# Patient Record
Sex: Male | Born: 1975 | Race: White | Hispanic: No | Marital: Single | Smoking: Former smoker
Health system: Southern US, Community
[De-identification: ages and names within clinical notes are randomized; demographics above are authoritative.]

## PROBLEM LIST (undated history)

## (undated) DIAGNOSIS — I10 Essential (primary) hypertension: Secondary | ICD-10-CM

## (undated) DIAGNOSIS — J449 Chronic obstructive pulmonary disease, unspecified: Secondary | ICD-10-CM

## (undated) DIAGNOSIS — G4730 Sleep apnea, unspecified: Secondary | ICD-10-CM

## (undated) HISTORY — DX: Sleep apnea, unspecified: G47.30

## (undated) HISTORY — PX: LESION REMOVAL: SHX5196

---

## 2012-09-04 ENCOUNTER — Inpatient Hospital Stay: Payer: Self-pay | Admitting: Internal Medicine

## 2012-09-04 LAB — URINALYSIS, COMPLETE
Bilirubin,UR: NEGATIVE
Glucose,UR: NEGATIVE mg/dL (ref 0–75)
Leukocyte Esterase: NEGATIVE
Nitrite: NEGATIVE
Protein: NEGATIVE
RBC,UR: <1 /HPF (ref 0–5)
Specific Gravity: 1.017 (ref 1.003–1.030)
Squamous Epithelial: NONE SEEN
WBC UR: <1 /HPF (ref 0–5)

## 2012-09-04 LAB — COMPREHENSIVE METABOLIC PANEL
Albumin: 3.9 g/dL (ref 3.4–5.0)
Alkaline Phosphatase: 77 U/L (ref 50–136)
Anion Gap: 8 (ref 7–16)
Calcium, Total: 8.8 mg/dL (ref 8.5–10.1)
Chloride: 103 mmol/L (ref 98–107)
Co2: 26 mmol/L (ref 21–32)
Creatinine: 1.04 mg/dL (ref 0.60–1.30)
EGFR (Non-African Amer.): >60
Glucose: 138 mg/dL — ABNORMAL HIGH (ref 65–99)
Potassium: 3.6 mmol/L (ref 3.5–5.1)

## 2012-09-04 LAB — CBC
MCH: 29.9 pg (ref 26.0–34.0)
MCHC: 34.9 g/dL (ref 32.0–36.0)
MCV: 86 fL (ref 80–100)
RBC: 4.78 10*6/uL (ref 4.40–5.90)
RDW: 13.4 % (ref 11.5–14.5)
WBC: 17.6 10*3/uL — ABNORMAL HIGH (ref 3.8–10.6)

## 2012-09-05 LAB — CBC WITH DIFFERENTIAL/PLATELET
Basophil #: 0 10*3/uL (ref 0.0–0.1)
Eosinophil #: 0 10*3/uL (ref 0.0–0.7)
Eosinophil %: 0.2 %
HCT: 38.3 % — ABNORMAL LOW (ref 40.0–52.0)
HGB: 13.4 g/dL (ref 13.0–18.0)
Lymphocyte #: 1.5 10*3/uL (ref 1.0–3.6)
Lymphocyte %: 14 %
MCH: 30.4 pg (ref 26.0–34.0)
MCV: 87 fL (ref 80–100)
Monocyte #: 0.5 x10 3/mm (ref 0.2–1.0)
Platelet: 191 10*3/uL (ref 150–440)
RBC: 4.42 10*6/uL (ref 4.40–5.90)

## 2012-09-05 LAB — BASIC METABOLIC PANEL
BUN: 8 mg/dL (ref 7–18)
Calcium, Total: 8.5 mg/dL (ref 8.5–10.1)
Creatinine: 0.81 mg/dL (ref 0.60–1.30)
EGFR (Non-African Amer.): >60
Glucose: 124 mg/dL — ABNORMAL HIGH (ref 65–99)
Osmolality: 279 (ref 275–301)

## 2012-12-26 ENCOUNTER — Ambulatory Visit: Payer: Self-pay | Admitting: Specialist

## 2013-01-24 ENCOUNTER — Ambulatory Visit: Payer: Self-pay | Admitting: Specialist

## 2013-07-22 ENCOUNTER — Encounter: Payer: Self-pay | Admitting: General Surgery

## 2013-07-29 ENCOUNTER — Ambulatory Visit (INDEPENDENT_AMBULATORY_CARE_PROVIDER_SITE_OTHER): Payer: BC Managed Care – PPO | Admitting: General Surgery

## 2013-07-29 ENCOUNTER — Encounter: Payer: Self-pay | Admitting: General Surgery

## 2013-07-29 VITALS — BP 150/82 | HR 92 | Resp 14 | Ht 70.0 in | Wt 298.0 lb

## 2013-07-29 DIAGNOSIS — D1779 Benign lipomatous neoplasm of other sites: Secondary | ICD-10-CM

## 2013-07-29 DIAGNOSIS — D171 Benign lipomatous neoplasm of skin and subcutaneous tissue of trunk: Secondary | ICD-10-CM

## 2013-07-29 NOTE — Patient Instructions (Addendum)
Patient to be scheduled for excision of lipoma on back. The patient is aware to call back for any questions or concerns.   Patient is scheduled for surgery at [facility] on 08/20/13. He will pre admit by phone on 08/13/13. Patient is aware of date and instructions.

## 2013-07-29 NOTE — Progress Notes (Signed)
Patient ID: Brandon Bender, male   DOB: 1975-09-13, 38 y.o.   MRN: [id number]  Chief Complaint  Patient presents with  . Other    cyst on back    HPI Brandon Bender is a 38 y.o. male presents for an evaluation of a cyst on his back. He noticed it approximately 2 months ago. He started having shooting pain approximately 1 month that comes and goes. He states certain ways he twists irritates the area. No drainage noted.   HPI  Past Medical History  Diagnosis Date  . Sleep apnea     History reviewed. No pertinent past surgical history.  Family History  Problem Relation Age of Onset  . Cancer Mother     ovarian  . Cancer Maternal Grandmother     pancreatic, stomach, liver    Social History History  Substance Use Topics  . Smoking status: Former Smoker -- 1.00 packs/day for 5 years    Types: Cigarettes  . Smokeless tobacco: Current User    Types: Chew  . Alcohol Use: No    No Known Allergies  Current Outpatient Prescriptions  Medication Sig Dispense Refill  . aspirin 81 MG chewable tablet Chew 81 mg by mouth daily.      . Biotin 5000 MCG CAPS Take 1 capsule by mouth daily.      . Multiple Vitamins-Minerals (MULTIVITAMIN WITH MINERALS) tablet Take 1 tablet by mouth daily.       No current facility-administered medications for this visit.    Review of Systems Review of Systems  Constitutional: Negative.   Respiratory: Negative.   Cardiovascular: Negative.     Blood pressure 150/82, pulse 92, resp. rate 14, height 5\' 10"  (1.778 m), weight 298 lb (135.172 kg).  Physical Exam Physical Exam  Constitutional: He is oriented to person, place, and time. He appears well-developed and well-nourished.  Cardiovascular: Normal rate, regular rhythm and normal heart sounds.   No murmur heard. Pulmonary/Chest: Effort normal and breath sounds normal.  Neurological: He is alert and oriented to person, place, and time.  Skin: Skin is warm and dry.  3 cm subcutaneous  mass located to the left of the spine lower most chest area.     Data Reviewed   Assessment    Lipoma, symptomatic. Recommended excision.. This was explained fully to pt and he is agreeable.    Plan    Patient to be scheduled for out patient surgery for excision of lipoma on back.     Patient is scheduled for surgery at [facility] on 08/20/13. He will pre admit by phone on 08/13/13. Patient is aware of date and instructions.    Brandon Bender 08/01/2013, 8:39 AM

## 2013-08-01 ENCOUNTER — Encounter: Payer: Self-pay | Admitting: General Surgery

## 2013-08-06 ENCOUNTER — Other Ambulatory Visit: Payer: Self-pay | Admitting: General Surgery

## 2013-08-06 DIAGNOSIS — D171 Benign lipomatous neoplasm of skin and subcutaneous tissue of trunk: Secondary | ICD-10-CM

## 2013-08-20 ENCOUNTER — Ambulatory Visit: Payer: Self-pay | Admitting: General Surgery

## 2013-08-20 DIAGNOSIS — D216 Benign neoplasm of connective and other soft tissue of trunk, unspecified: Secondary | ICD-10-CM

## 2013-08-21 LAB — PATHOLOGY REPORT

## 2013-08-24 ENCOUNTER — Encounter: Payer: Self-pay | Admitting: General Surgery

## 2013-08-31 ENCOUNTER — Encounter: Payer: Self-pay | Admitting: General Surgery

## 2013-08-31 ENCOUNTER — Ambulatory Visit (INDEPENDENT_AMBULATORY_CARE_PROVIDER_SITE_OTHER): Payer: BC Managed Care – PPO | Admitting: General Surgery

## 2013-08-31 VITALS — BP 150/82 | HR 82 | Resp 14 | Ht 70.0 in | Wt 295.0 lb

## 2013-08-31 DIAGNOSIS — D1779 Benign lipomatous neoplasm of other sites: Secondary | ICD-10-CM

## 2013-08-31 DIAGNOSIS — D171 Benign lipomatous neoplasm of skin and subcutaneous tissue of trunk: Secondary | ICD-10-CM

## 2013-08-31 NOTE — Progress Notes (Signed)
This is a 38 year old male here today for his post op back lipoma excision done on 08/20/13. The patient is doing well.   Incision is clean healing and clean. No seroma or infection.  Pt reports prior pain from lipoma has recolved. RTC prn

## 2013-08-31 NOTE — Patient Instructions (Signed)
Patient to return as needed. 

## 2014-08-06 NOTE — Discharge Summary (Signed)
PATIENT NAME:  Brandon Bender, Brandon Bender MR#:  [id number] DATE OF BIRTH:  26-Sep-1975  DATE OF ADMISSION:  09/04/2012 DATE OF DISCHARGE:  09/05/2012  ADMITTING DIAGNOSES:  Fever, chills, shortness of breath and cough.   DISCHARGE DIAGNOSES: 1.  Fevers, chills, shortness of breath and cough due to acute left lower lobe pneumonia.  2.  Possible sepsis last Thursday due to pneumonia, now resolved.   CONSULTANTS: None.  PERTINENT LABORATORY EVALUATIONS: Admitting glucose was 138, BUN 12, creatinine 1.04, sodium 137, potassium 3.6, chloride 103. CO2 was 26. Calcium was 8.8. LFTs were normal. WBC count was 17.6, platelet count 202. Most recent WBC count on May 23 is 10.7. Blood cultures x 2, no growth. Influenza was negative. Urine culture no growth. ABG pH of 7.46, pCO2 of 35, pO2 of 69.  Chest x-ray showed a left lower lobe pneumonia.   HOSPITAL COURSE:  Please refer to H and P done by the admitting physician. The patient is a 39 year old male with no significant history presented with fevers, chills, shortness of breath and cough. The patient was tachycardic at a heart rate of 140 on presentation. He also had leukocytosis. He was admitted for possible sepsis syndrome due to pneumonia. The patient's heart rate is now normalized. He still has some low-grade temperature but he is feeling much better and is anxious to go home. He was noted to have a left lower lobe pneumonia and was started on ceftriaxone and azithromycin.  For ease of use, he will be changed to p.o. Levaquin. At this time, he is stable for discharge.   DISCHARGE MEDICATIONS: 1.  Ibuprofen 1 tab p.o. t.i.d. as needed as previously. 2.  Ventolin 2 puffs 4 times a day. 3.  Tylenol 650 q. 4 p.r.n. for temperature greater 100.4. 4.  Levaquin 750 one 1 tab p.o. q. 24 x 5 days.   DIET:  Regular.   ACTIVITY:  As tolerated.   FOLLOWUP:  With primary MD in 1 to 2 weeks.   Note:  35 minutes spent on the discharge.       ____________________________ Lafonda Mosses. Posey Pronto, MD shp:ce D: 09/05/2012 15:09:09 ET T: 09/05/2012 17:19:21 ET JOB#: [id number]  cc: Shailen Thielen H. Posey Pronto, MD, <Dictator> Alric Seton MD ELECTRONICALLY SIGNED 09/11/2012 14:50

## 2014-08-06 NOTE — H&P (Signed)
PATIENT NAME:  Brandon Bender, Brandon Bender MR#:  [id number] DATE OF BIRTH:  April 03, 1976  DATE OF ADMISSION:  09/04/2012  REFERRING PHYSICIAN:  Marta Antu, MD  PRIMARY CARE PHYSICIAN:  None.  CHIEF COMPLAINT: Fever, chills, shortness of breath and cough.   HISTORY OF PRESENT ILLNESS: This is a 39 year old male without significant past medical history who presents with the above complaints. He is reporting for the last 24 hours he has been having fever and chills, as well has been complaining of cough. He reports nonproductive, but he feels congested, cannot cough out.  As well, he reports a couple of people at work had the same symptoms yesterday.  He as well complains of sweating, having pleuritic chest pain with deep inspiration and coughing.  In the ED, the patient was extremely tachycardic upon presentation, at 140.  As well, he had significant leukocytosis of 17.6 thousand. As well, he was febrile at 103 and he was hypoxic to 93% on room air where he currently is improved to 97% on 2 liters nasal cannula. The patient's chest x-ray on the lateral view did show left lower lung infiltrate. The patient had blood cultures sent and started on Rocephin and Zithromax in the ED for community-acquired pneumonia.   PAST MEDICAL HISTORY: None.   PAST SURGICAL HISTORY: None.   ALLERGIES: No known drug allergies.   HOME MEDICATIONS: Advil as needed.   SOCIAL HISTORY: The patient quit smoking 3 years ago. Denies any alcohol or illicit drug use.   FAMILY HISTORY: Significant for diabetes mellitus in the family. Mother has sarcoidosis.    REVIEW OF SYSTEMS:  CONSTITUTIONAL:  The patient complains of fever, fatigue, weakness.  EYES: Denies blurry vision, double vision, pain, inflammation, glaucoma.  ENT: Denies tinnitus, ear pain, epistaxis, hearing loss.  RESPIRATORY: Complains of cough, shortness of breath. Denies any COPD or asthma.  CARDIOVASCULAR: Complains of pleuritic chest pain mainly upon coughing  and deep inspiration. Denies any edema, arrhythmia, palpitations, syncope.  GASTROINTESTINAL: Denies nausea, vomiting, diarrhea, abdominal pain, hematemesis, jaundice, rectal bleed, constipation.  GENITOURINARY: Denies dysuria, hematuria, renal colic.  ENDOCRINE: Denies polyuria, polydipsia, heat or cold intolerance.  HEMATOLOGY: Denies anemia, easy bruising, bleeding diathesis.  INTEGUMENTARY: Denies any acne, rash or skin lesions.  MUSCULOSKELETAL: Denies any swelling, gout, redness, limited activity, arthritis, or cramps.  NEUROLOGIC: Denies numbness, dysarthria, epilepsy, tremors, vertigo, CVA, TIA, or memory loss.  PSYCHIATRIC: Denies anxiety, insomnia, bipolar disorder, schizophrenia, or nervousness.   PHYSICAL EXAMINATION: VITAL SIGNS: Temperature 99.8, T-max 103, pulse 108, upon presentation 138, respiratory rate 16, upon presentation 24, blood pressure 102/57, and saturating 97% on 2 liters nasal cannula.  GENERAL: Obese male who looks comfortable in bed, in no acute distress.  HEENT: Head atraumatic, normocephalic. Pupils equal and reactive to light. Pink conjunctivae. Anicteric sclerae. Moist oral mucosa.  NECK: Supple. No thyromegaly. No JVD.  CHEST:  Good air entry bilaterally. No use of accessory muscles. The patient has left side rales and rhonchi. No wheezing.  CARDIOVASCULAR: S1, S2 heard. No rubs, murmurs, or gallops.  ABDOMEN: Soft, nontender, and nondistended. Bowel sounds present.  EXTREMITIES: No edema. No clubbing. No cyanosis. Dorsalis pedis pulse +2 bilaterally.  NEUROLOGIC: Cranial nerves grossly intact. No focal sensory or motor deficits.  PSYCHIATRIC: Appropriate affect. Awake and alert x 3. Intact judgment and insight. LYMPHATICS: No lymph nodes could be palpated in the axillary or cervical area.  SKIN: Normal skin turgor. Warm and dry. No rash.   PERTINENT LABORATORY AND DIAGNOSTICS: Glucose 138, BUN  12, creatinine 1.04, sodium 137, potassium 3.6, chloride 103,  CO2 26, ALT 51, AST 30, alk phos 77. White blood cells 17.6, hemoglobin 14.3, hematocrit 41, platelets 202.   ABG showing pH of 7.46, pCO2 of 35, and pO2 of 69 on nasal cannula.   Chest x-ray:  Initial evaluation by ED physician showing left infiltrate on the lateral view chest x-ray.  ASSESSMENT AND PLAN: 1.  Sepsis. The patient presents with tachycardia, tachypnea, fever, and leukocytosis.  His source of sepsis is related to his pneumonia. The patient had blood cultures sent and is on aggressive intravenous fluid resuscitation and his tachycardia improved with that.   2.  Pneumonia. Will treat the patient for community-acquired pneumonia.  Will start the patient on Rocephin and Zithromax. We will follow on the blood cultures. We will check sputum cultures. Will have the patient on DuoNeb every 6 hours secondary to his rales and rhonchi. Will start him on Mucinex as well.  3.  Deep vein thrombosis prophylaxis. The patient is young and ambulatory. Will have him on sequential compression device.  4.  Gastrointestinal prophylaxis. On Protonix.   CODE STATUS: FULL CODE.   TOTAL TIME SPENT ON ADMISSION AND PATIENT CARE: 50 minutes.  ____________________________ Albertine Patricia, MD dse:sb D: 09/04/2012 07:36:30 ET T: 09/04/2012 07:47:52 ET JOB#: [id number]  cc: Albertine Patricia, MD, <Dictator> Roselia Snipe Graciela Husbands MD ELECTRONICALLY SIGNED 09/06/2012 4:26
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
## 2015-09-27 DIAGNOSIS — H5213 Myopia, bilateral: Secondary | ICD-10-CM | POA: Diagnosis not present

## 2016-01-25 DIAGNOSIS — Z23 Encounter for immunization: Secondary | ICD-10-CM | POA: Diagnosis not present

## 2016-02-21 DIAGNOSIS — G4733 Obstructive sleep apnea (adult) (pediatric): Secondary | ICD-10-CM | POA: Diagnosis not present

## 2016-05-24 DIAGNOSIS — G4733 Obstructive sleep apnea (adult) (pediatric): Secondary | ICD-10-CM | POA: Diagnosis not present

## 2017-01-23 DIAGNOSIS — Z23 Encounter for immunization: Secondary | ICD-10-CM | POA: Diagnosis not present

## 2017-06-21 ENCOUNTER — Emergency Department: Payer: BLUE CROSS/BLUE SHIELD

## 2017-06-21 ENCOUNTER — Emergency Department
Admission: EM | Admit: 2017-06-21 | Discharge: 2017-06-21 | Disposition: A | Discharge status: Home / Self Care | Payer: BLUE CROSS/BLUE SHIELD | Attending: Emergency Medicine | Admitting: Emergency Medicine

## 2017-06-21 ENCOUNTER — Encounter: Payer: Self-pay | Admitting: Emergency Medicine

## 2017-06-21 DIAGNOSIS — M25512 Pain in left shoulder: Secondary | ICD-10-CM | POA: Diagnosis not present

## 2017-06-21 DIAGNOSIS — Z87891 Personal history of nicotine dependence: Secondary | ICD-10-CM | POA: Insufficient documentation

## 2017-06-21 DIAGNOSIS — M898X1 Other specified disorders of bone, shoulder: Secondary | ICD-10-CM

## 2017-06-21 DIAGNOSIS — Y92410 Unspecified street and highway as the place of occurrence of the external cause: Secondary | ICD-10-CM | POA: Diagnosis not present

## 2017-06-21 DIAGNOSIS — S161XXA Strain of muscle, fascia and tendon at neck level, initial encounter: Secondary | ICD-10-CM | POA: Insufficient documentation

## 2017-06-21 DIAGNOSIS — Z7982 Long term (current) use of aspirin: Secondary | ICD-10-CM | POA: Insufficient documentation

## 2017-06-21 DIAGNOSIS — Y999 Unspecified external cause status: Secondary | ICD-10-CM | POA: Insufficient documentation

## 2017-06-21 DIAGNOSIS — S4992XA Unspecified injury of left shoulder and upper arm, initial encounter: Secondary | ICD-10-CM | POA: Diagnosis not present

## 2017-06-21 DIAGNOSIS — M542 Cervicalgia: Secondary | ICD-10-CM | POA: Diagnosis not present

## 2017-06-21 DIAGNOSIS — Y9389 Activity, other specified: Secondary | ICD-10-CM | POA: Diagnosis not present

## 2017-06-21 DIAGNOSIS — S199XXA Unspecified injury of neck, initial encounter: Secondary | ICD-10-CM | POA: Diagnosis not present

## 2017-06-21 MED ORDER — CYCLOBENZAPRINE HCL 10 MG PO TABS: 10.0000 mg | ORAL_TABLET | Freq: Three times a day (TID) | ORAL | 0 refills | Status: AC | PRN

## 2017-06-21 MED ORDER — NAPROXEN 500 MG PO TABS: 500.0000 mg | ORAL_TABLET | Freq: Two times a day (BID) | ORAL | 0 refills | Status: DC
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
## 2017-07-13 ENCOUNTER — Inpatient Hospital Stay
Admission: EM | Admit: 2017-07-13 | Discharge: 2017-07-15 | DRG: 871 | Disposition: A | Discharge status: Home / Self Care | Payer: BLUE CROSS/BLUE SHIELD | Attending: Family Medicine | Admitting: Family Medicine

## 2017-07-13 ENCOUNTER — Emergency Department: Payer: BLUE CROSS/BLUE SHIELD

## 2017-07-13 ENCOUNTER — Other Ambulatory Visit: Payer: Self-pay

## 2017-07-13 ENCOUNTER — Encounter: Payer: Self-pay | Admitting: Emergency Medicine

## 2017-07-13 DIAGNOSIS — F17220 Nicotine dependence, chewing tobacco, uncomplicated: Secondary | ICD-10-CM | POA: Diagnosis not present

## 2017-07-13 DIAGNOSIS — Z800 Family history of malignant neoplasm of digestive organs: Secondary | ICD-10-CM

## 2017-07-13 DIAGNOSIS — R000 Tachycardia, unspecified: Secondary | ICD-10-CM | POA: Diagnosis not present

## 2017-07-13 DIAGNOSIS — J441 Chronic obstructive pulmonary disease with (acute) exacerbation: Secondary | ICD-10-CM | POA: Diagnosis not present

## 2017-07-13 DIAGNOSIS — Z79899 Other long term (current) drug therapy: Secondary | ICD-10-CM

## 2017-07-13 DIAGNOSIS — T380X5A Adverse effect of glucocorticoids and synthetic analogues, initial encounter: Secondary | ICD-10-CM | POA: Diagnosis present

## 2017-07-13 DIAGNOSIS — R6520 Severe sepsis without septic shock: Secondary | ICD-10-CM | POA: Diagnosis not present

## 2017-07-13 DIAGNOSIS — Z7982 Long term (current) use of aspirin: Secondary | ICD-10-CM

## 2017-07-13 DIAGNOSIS — I10 Essential (primary) hypertension: Secondary | ICD-10-CM | POA: Diagnosis not present

## 2017-07-13 DIAGNOSIS — E1165 Type 2 diabetes mellitus with hyperglycemia: Secondary | ICD-10-CM | POA: Diagnosis not present

## 2017-07-13 DIAGNOSIS — Z8041 Family history of malignant neoplasm of ovary: Secondary | ICD-10-CM

## 2017-07-13 DIAGNOSIS — Z8701 Personal history of pneumonia (recurrent): Secondary | ICD-10-CM | POA: Diagnosis not present

## 2017-07-13 DIAGNOSIS — J209 Acute bronchitis, unspecified: Secondary | ICD-10-CM | POA: Diagnosis present

## 2017-07-13 DIAGNOSIS — Z6841 Body Mass Index (BMI) 40.0 and over, adult: Secondary | ICD-10-CM | POA: Diagnosis not present

## 2017-07-13 DIAGNOSIS — A419 Sepsis, unspecified organism: Secondary | ICD-10-CM

## 2017-07-13 DIAGNOSIS — Z9989 Dependence on other enabling machines and devices: Secondary | ICD-10-CM | POA: Diagnosis not present

## 2017-07-13 DIAGNOSIS — J101 Influenza due to other identified influenza virus with other respiratory manifestations: Secondary | ICD-10-CM | POA: Diagnosis not present

## 2017-07-13 DIAGNOSIS — J9601 Acute respiratory failure with hypoxia: Secondary | ICD-10-CM | POA: Diagnosis not present

## 2017-07-13 DIAGNOSIS — J440 Chronic obstructive pulmonary disease with acute lower respiratory infection: Secondary | ICD-10-CM | POA: Diagnosis not present

## 2017-07-13 DIAGNOSIS — J111 Influenza due to unidentified influenza virus with other respiratory manifestations: Secondary | ICD-10-CM | POA: Diagnosis not present

## 2017-07-13 DIAGNOSIS — G4733 Obstructive sleep apnea (adult) (pediatric): Secondary | ICD-10-CM | POA: Diagnosis present

## 2017-07-13 DIAGNOSIS — R062 Wheezing: Secondary | ICD-10-CM | POA: Diagnosis not present

## 2017-07-13 HISTORY — DX: Essential (primary) hypertension: I10

## 2017-07-13 HISTORY — DX: Chronic obstructive pulmonary disease, unspecified: J44.9

## 2017-07-13 LAB — COMPREHENSIVE METABOLIC PANEL
ALT: 82 U/L — ABNORMAL HIGH (ref 17–63)
ANION GAP: 12 (ref 5–15)
AST: 74 U/L — AB (ref 15–41)
Albumin: 4.3 g/dL (ref 3.5–5.0)
Alkaline Phosphatase: 79 U/L (ref 38–126)
BILIRUBIN TOTAL: 0.9 mg/dL (ref 0.3–1.2)
BUN: 8 mg/dL (ref 6–20)
CO2: 26 mmol/L (ref 22–32)
Calcium: 9.1 mg/dL (ref 8.9–10.3)
Chloride: 99 mmol/L — ABNORMAL LOW (ref 101–111)
Creatinine, Ser: 0.81 mg/dL (ref 0.61–1.24)
GFR calc Af Amer: >60 mL/min (ref >60)
GFR calc non Af Amer: >60 mL/min (ref >60)
Glucose, Bld: 345 mg/dL — ABNORMAL HIGH (ref 65–99)
POTASSIUM: 3.9 mmol/L (ref 3.5–5.1)
Sodium: 137 mmol/L (ref 135–145)
TOTAL PROTEIN: 7.8 g/dL (ref 6.5–8.1)

## 2017-07-13 LAB — PROTIME-INR
INR: 0.93
PROTHROMBIN TIME: 12.4 s (ref 11.4–15.2)

## 2017-07-13 LAB — CBC WITH DIFFERENTIAL/PLATELET
Basophils Absolute: 0.1 10*3/uL (ref 0–0.1)
Basophils Relative: 1 %
Eosinophils Absolute: 0 10*3/uL (ref 0–0.7)
Eosinophils Relative: 0 %
HEMATOCRIT: 46.3 % (ref 40.0–52.0)
HEMOGLOBIN: 15.6 g/dL (ref 13.0–18.0)
LYMPHS ABS: 1 10*3/uL (ref 1.0–3.6)
Lymphocytes Relative: 8 %
MCH: 29.3 pg (ref 26.0–34.0)
MCHC: 33.8 g/dL (ref 32.0–36.0)
MCV: 86.7 fL (ref 80.0–100.0)
MONOS PCT: 5 %
Monocytes Absolute: 0.6 10*3/uL (ref 0.2–1.0)
NEUTROS PCT: 86 %
Neutro Abs: 10.7 10*3/uL — ABNORMAL HIGH (ref 1.4–6.5)
Platelets: 207 10*3/uL (ref 150–440)
RBC: 5.34 MIL/uL (ref 4.40–5.90)
RDW: 13.2 % (ref 11.5–14.5)
WBC: 12.3 10*3/uL — AB (ref 3.8–10.6)

## 2017-07-13 LAB — INFLUENZA PANEL BY PCR (TYPE A & B)
INFLBPCR: NEGATIVE
Influenza A By PCR: POSITIVE — AB

## 2017-07-13 LAB — LACTIC ACID, PLASMA
Lactic Acid, Venous: 3.6 mmol/L (ref 0.5–1.9)
Lactic Acid, Venous: 3.4 mmol/L (ref 0.5–1.9)

## 2017-07-13 LAB — CK: CK TOTAL: 217 U/L (ref 49–397)

## 2017-07-13 MED ORDER — HEPARIN SODIUM (PORCINE) 5000 UNIT/ML IJ SOLN
5000.0000 [IU] | Freq: Three times a day (TID) | INTRAMUSCULAR | Status: DC
  Administered 2017-07-14 (×4): 5000 [IU] via SUBCUTANEOUS
  Filled 2017-07-13 (×5): qty 1

## 2017-07-13 MED ORDER — ACETAMINOPHEN 325 MG PO TABS
650.0000 mg | ORAL_TABLET | Freq: Once | ORAL | Status: AC | PRN
  Administered 2017-07-13: 650 mg via ORAL
  Filled 2017-07-13: qty 2

## 2017-07-13 MED ORDER — SODIUM CHLORIDE 0.9 % IV SOLN: 500.0000 mg | INTRAVENOUS | Status: DC

## 2017-07-13 MED ORDER — IPRATROPIUM-ALBUTEROL 0.5-2.5 (3) MG/3ML IN SOLN
3.0000 mL | Freq: Once | RESPIRATORY_TRACT | Status: AC
  Administered 2017-07-13: 3 mL via RESPIRATORY_TRACT
  Filled 2017-07-13: qty 3

## 2017-07-13 MED ORDER — VITAMIN C 500 MG PO TABS: 1000.0000 mg | ORAL_TABLET | Freq: Every day | ORAL | Status: DC

## 2017-07-13 MED ORDER — ASPIRIN EC 81 MG PO TBEC
81.0000 mg | DELAYED_RELEASE_TABLET | Freq: Every day | ORAL | Status: DC
  Administered 2017-07-14 – 2017-07-15 (×2): 81 mg via ORAL
  Filled 2017-07-13 (×2): qty 1

## 2017-07-13 MED ORDER — OSELTAMIVIR PHOSPHATE 75 MG PO CAPS
75.0000 mg | ORAL_CAPSULE | Freq: Two times a day (BID) | ORAL | Status: DC
  Administered 2017-07-14 – 2017-07-15 (×3): 75 mg via ORAL
  Filled 2017-07-13 (×4): qty 1

## 2017-07-13 MED ORDER — DOCUSATE SODIUM 100 MG PO CAPS
100.0000 mg | ORAL_CAPSULE | Freq: Two times a day (BID) | ORAL | Status: DC
  Administered 2017-07-14 – 2017-07-15 (×3): 100 mg via ORAL
  Filled 2017-07-13 (×3): qty 1

## 2017-07-13 MED ORDER — ONDANSETRON HCL 4 MG PO TABS: 4.0000 mg | ORAL_TABLET | Freq: Four times a day (QID) | ORAL | Status: DC | PRN

## 2017-07-13 MED ORDER — DOXYCYCLINE HYCLATE 100 MG PO TABS
100.0000 mg | ORAL_TABLET | Freq: Once | ORAL | Status: AC
  Administered 2017-07-13: 100 mg via ORAL
  Filled 2017-07-13: qty 1

## 2017-07-13 MED ORDER — SODIUM CHLORIDE 0.9 % IV SOLN
INTRAVENOUS | Status: DC
  Administered 2017-07-14 – 2017-07-15 (×4): via INTRAVENOUS

## 2017-07-13 MED ORDER — BIOTIN 5000 MCG PO CAPS: 1.0000 | ORAL_CAPSULE | Freq: Every day | ORAL | Status: DC

## 2017-07-13 MED ORDER — CEFTRIAXONE SODIUM 1 G IJ SOLR
1.0000 g | INTRAMUSCULAR | Status: DC
  Administered 2017-07-14: 1 g via INTRAVENOUS
  Filled 2017-07-13 (×2): qty 10

## 2017-07-13 MED ORDER — OSELTAMIVIR PHOSPHATE 75 MG PO CAPS
75.0000 mg | ORAL_CAPSULE | Freq: Once | ORAL | Status: AC
  Administered 2017-07-13: 75 mg via ORAL
  Filled 2017-07-13: qty 1

## 2017-07-13 MED ORDER — HYDROCODONE-ACETAMINOPHEN 5-325 MG PO TABS
1.0000 | ORAL_TABLET | ORAL | Status: DC | PRN
  Administered 2017-07-14: 1 via ORAL
  Filled 2017-07-13: qty 1

## 2017-07-13 MED ORDER — SODIUM CHLORIDE 0.9 % IV SOLN
1.0000 g | Freq: Once | INTRAVENOUS | Status: AC
  Administered 2017-07-13: 1 g via INTRAVENOUS
  Filled 2017-07-13: qty 10

## 2017-07-13 MED ORDER — SODIUM CHLORIDE 0.9 % IV BOLUS
1000.0000 mL | Freq: Once | INTRAVENOUS | Status: AC
  Administered 2017-07-13: 1000 mL via INTRAVENOUS

## 2017-07-13 MED ORDER — INSULIN ASPART 100 UNIT/ML ~~LOC~~ SOLN
0.0000 [IU] | Freq: Every day | SUBCUTANEOUS | Status: DC
  Administered 2017-07-14: 3 [IU] via SUBCUTANEOUS
  Filled 2017-07-13: qty 1

## 2017-07-13 MED ORDER — BISACODYL 5 MG PO TBEC: 5.0000 mg | DELAYED_RELEASE_TABLET | Freq: Every day | ORAL | Status: DC | PRN

## 2017-07-13 MED ORDER — METHYLPREDNISOLONE SODIUM SUCC 125 MG IJ SOLR
125.0000 mg | Freq: Once | INTRAMUSCULAR | Status: AC
  Administered 2017-07-13: 125 mg via INTRAVENOUS
  Filled 2017-07-13: qty 2

## 2017-07-13 MED ORDER — ACETAMINOPHEN 325 MG PO TABS
650.0000 mg | ORAL_TABLET | Freq: Four times a day (QID) | ORAL | Status: DC | PRN
  Administered 2017-07-15: 650 mg via ORAL
  Filled 2017-07-13 (×2): qty 2

## 2017-07-13 MED ORDER — ONDANSETRON HCL 4 MG/2ML IJ SOLN
4.0000 mg | Freq: Four times a day (QID) | INTRAMUSCULAR | Status: DC | PRN
  Administered 2017-07-15: 4 mg via INTRAVENOUS
  Filled 2017-07-13: qty 2

## 2017-07-13 MED ORDER — INSULIN ASPART 100 UNIT/ML ~~LOC~~ SOLN
0.0000 [IU] | Freq: Three times a day (TID) | SUBCUTANEOUS | Status: DC
  Administered 2017-07-14: 11 [IU] via SUBCUTANEOUS
  Administered 2017-07-14: 8 [IU] via SUBCUTANEOUS
  Administered 2017-07-14: 3 [IU] via SUBCUTANEOUS
  Administered 2017-07-15: 5 [IU] via SUBCUTANEOUS
  Filled 2017-07-13 (×4): qty 1

## 2017-07-13 MED ORDER — ADULT MULTIVITAMIN W/MINERALS CH: 1.0000 | ORAL_TABLET | Freq: Every day | ORAL | Status: DC

## 2017-07-13 MED ORDER — ACETAMINOPHEN 650 MG RE SUPP: 650.0000 mg | Freq: Four times a day (QID) | RECTAL | Status: DC | PRN

## 2017-07-13 MED ORDER — CYCLOBENZAPRINE HCL 10 MG PO TABS: 10.0000 mg | ORAL_TABLET | Freq: Three times a day (TID) | ORAL | Status: DC | PRN

## 2017-07-13 MED ORDER — IPRATROPIUM-ALBUTEROL 0.5-2.5 (3) MG/3ML IN SOLN
3.0000 mL | Freq: Four times a day (QID) | RESPIRATORY_TRACT | Status: DC
  Administered 2017-07-14 – 2017-07-15 (×5): 3 mL via RESPIRATORY_TRACT
  Filled 2017-07-13 (×6): qty 3

## 2017-07-13 MED ORDER — ACETAMINOPHEN 500 MG PO TABS
1000.0000 mg | ORAL_TABLET | Freq: Once | ORAL | Status: DC
  Filled 2017-07-13: qty 2

## 2017-07-13 MED ORDER — METHYLPREDNISOLONE SODIUM SUCC 125 MG IJ SOLR
80.0000 mg | Freq: Two times a day (BID) | INTRAMUSCULAR | Status: DC
  Administered 2017-07-14: 80 mg via INTRAVENOUS
  Filled 2017-07-13: qty 2

## 2017-07-13 NOTE — ED Provider Notes (Signed)
[facility] Emergency Department Provider Note  ____________________________________________  Time seen: Approximately 9:54 PM  I have reviewed the triage vital signs and the nursing notes.   HISTORY  Chief Complaint Blood Infection   HPI Brandon Nuzum. is a 42 y.o. male the history of COPD, obstructive sleep apnea, hypertension who presents for evaluation of cough, wheezing, shortness of breath.  Patient reports that his symptoms started today. Cough productive of clear sputum, shortness of breath, wheezing. Has had nausea and one episode of nonbloody nonbilious emesis.  No diarrhea, no abdominal pain.  Patient reports that his shortness of breath is severe, constant, worse with minimal exertion.  He also has had a fever since earlier this morning.  He reports having a similar episode in the setting of pneumonia.  Patient reports that he stopped smoking 10 years ago.  He does not have an inhaler at home.   Past Medical History:  Diagnosis Date  . COPD (chronic obstructive pulmonary disease) ([place])   . Hypertension   . Sleep apnea     There are no active problems to display for this patient.   Past Surgical History:  Procedure Laterality Date  . LESION REMOVAL     back    Prior to Admission medications   Medication Sig Start Date End Date Taking? Authorizing Provider  aspirin 81 MG EC tablet Take 81 mg by mouth daily as needed.    Yes [provider]  Biotin 5000 MCG CAPS Take 1 capsule by mouth daily.   Yes [provider]  cyclobenzaprine (FLEXERIL) 10 MG tablet Take 1 tablet (10 mg total) by mouth 3 (three) times daily as needed for muscle spasms. 06/21/17  Yes Triplett, Cari B, FNP  Multiple Vitamins-Minerals (MULTIVITAMIN WITH MINERALS) tablet Take 1 tablet by mouth daily.   Yes [provider]  naproxen (NAPROSYN) 500 MG tablet Take 1 tablet (500 mg total) by mouth 2 (two) times daily with a meal. Patient  taking differently: Take 500 mg by mouth 2 (two) times daily as needed.  06/21/17  Yes Triplett, Cari B, FNP  omega-3 acid ethyl esters (LOVAZA) 1 g capsule Take 1 g by mouth daily.   Yes [provider]  vitamin C (ASCORBIC ACID) 500 MG tablet Take 1,000 mg by mouth daily.   Yes [provider]    Allergies Patient has no known allergies.  Family History  Problem Relation Age of Onset  . Cancer Mother        ovarian  . Cancer Maternal Grandmother        pancreatic, stomach, liver    Social History Social History   Tobacco Use  . Smoking status: Former Smoker    Packs/day: 1.00    Years: 5.00    Pack years: 5.00    Types: Cigarettes  . Smokeless tobacco: Current User    Types: Chew  Substance Use Topics  . Alcohol use: No  . Drug use: No    Review of Systems  Constitutional: + fever. Eyes: Negative for visual changes. ENT: Negative for sore throat. Neck: No neck pain  Cardiovascular: Negative for chest pain. Respiratory: +shortness of breath, cough Gastrointestinal: Negative for abdominal pain or diarrhea. + N.V Genitourinary: Negative for dysuria. Musculoskeletal: Negative for back pain. Skin: Negative for rash. Neurological: Negative for headaches, weakness or numbness. Psych: No SI or HI  ____________________________________________   PHYSICAL EXAM:  VITAL SIGNS: ED Triage Vitals  Enc Vitals Group  BP 07/13/17 1942 (!) 157/79     Pulse Rate 07/13/17 1942 (!) 138     Resp 07/13/17 1942 (!) 26     Temp 07/13/17 1942 (!) 102.1 F (38.9 C)     Temp src --      SpO2 07/13/17 1941 93 %     Weight 07/13/17 1943 295 lb (133.8 kg)     Height 07/13/17 1943 5\' 8"  (1.727 m)     Head Circumference --      Peak Flow --      Pain Score 07/13/17 1942 0     Pain Loc --      Pain Edu? --      Excl. in [place]? --     Constitutional: Alert and oriented. Well appearing and in no apparent distress. HEENT:      Head: Normocephalic and atraumatic.          Eyes: Conjunctivae are normal. Sclera is non-icteric.       Mouth/Throat: Mucous membranes are moist.       Neck: Supple with no signs of meningismus. Cardiovascular: Tachycardic with regular rhythm. No murmurs, gallops, or rubs. 2+ symmetrical distal pulses are present in all extremities. No JVD. Respiratory: Slightly increased work of breathing, hypoxic to 93% on room air, diffuse expiratory wheezes throughout Gastrointestinal: Soft, non tender, and non distended with positive bowel sounds. No rebound or guarding. Genitourinary: No CVA tenderness. Musculoskeletal: Nontender with normal range of motion in all extremities. No edema, cyanosis, or erythema of extremities. Neurologic: Normal speech and language. Face is symmetric. Moving all extremities. No gross focal neurologic deficits are appreciated. Skin: Skin is warm, dry and intact. No rash noted. Psychiatric: Mood and affect are normal. Speech and behavior are normal.  ____________________________________________   LABS (all labs ordered are listed, but only abnormal results are displayed)  Labs Reviewed  COMPREHENSIVE METABOLIC PANEL - Abnormal; Notable for the following components:      Result Value   Chloride 99 (*)    Glucose, Bld 345 (*)    AST 74 (*)    ALT 82 (*)    All other components within normal limits  LACTIC ACID, PLASMA - Abnormal; Notable for the following components:   Lactic Acid, Venous 3.6 (*)    All other components within normal limits  CBC WITH DIFFERENTIAL/PLATELET - Abnormal; Notable for the following components:   WBC 12.3 (*)    Neutro Abs 10.7 (*)    All other components within normal limits  INFLUENZA PANEL BY PCR (TYPE A & B) - Abnormal; Notable for the following components:   Influenza A By PCR POSITIVE (*)    All other components within normal limits  CULTURE, BLOOD (ROUTINE X 2)  CULTURE, BLOOD (ROUTINE X 2)  PROTIME-INR  CK  LACTIC ACID, PLASMA  URINALYSIS, COMPLETE (UACMP) WITH  MICROSCOPIC   ____________________________________________  EKG  ED ECG REPORT I, Rudene Re, the attending physician, personally viewed and interpreted this ECG.  Sinus tachycardia, rate of 137, prolonged QTC, normal axis, no ST elevations or depressions.  Unchanged when compared to prior from 2014 ____________________________________________  RADIOLOGY  I have personally reviewed the images performed during this visit and I agree with the Radiologist's read.   Interpretation by Radiologist:  Dg Chest 2 View  Result Date: 07/13/2017 CLINICAL DATA:  42 year old male with wheezing. EXAM: CHEST - 2 VIEW COMPARISON:  Chest radiograph dated 09/04/2012 FINDINGS: Mild peribronchial densities may represent reactive small airway disease. There is no  focal consolidation, pleural effusion, or pneumothorax. The cardiac silhouette is within normal limits. No acute osseous pathology. IMPRESSION: No focal consolidation. Findings may represent reactive small airway disease. Clinical correlation is recommended. Electronically Signed   By: Anner Crete M.D.   On: 07/13/2017 21:40     ____________________________________________   PROCEDURES  Procedure(s) performed: None Procedures Critical Care performed: yes   CRITICAL CARE Performed by: Rudene Re  ?  Total critical care time: 40 min  Critical care time was exclusive of separately billable procedures and treating other patients.  Critical care was necessary to treat or prevent imminent or life-threatening deterioration.  Critical care was time spent personally by me on the following activities: development of treatment plan with patient and/or surrogate as well as nursing, discussions with consultants, evaluation of patient's response to treatment, examination of patient, obtaining history from patient or surrogate, ordering and performing treatments and interventions, ordering and review of laboratory studies, ordering  and review of radiographic studies, pulse oximetry and re-evaluation of patient's condition.  ____________________________________________   INITIAL IMPRESSION / ASSESSMENT AND PLAN / ED COURSE  42 y.o. male the history of COPD, obstructive sleep apnea, hypertension who presents for evaluation of cough, wheezing, shortness of breath, and fever.  Patient arrives with increased work of breathing, sats of 93%, diffuse expiratory wheezes concerning for COPD exacerbation versus bronchitis versus pneumonia versus flu.  Patient is tachycardic, febrile and with an elevated lactic of 3.6, WBC 12.3. CXR showing RAD but no infiltrate. Influenza A positive. Patient meets criteria for sepsis from PNA + Flu. Given DuoNeb x3, Solu-Medrol, fluids, Tylenol, Rocephin, Doxy, Tamiflu. Patient is going to be admitted to the hospitalist service.      As part of my medical decision making, I reviewed the following data within the [place] notes reviewed and incorporated, Labs reviewed , EKG interpreted , Old EKG reviewed, Old chart reviewed, Radiograph reviewed , Discussed with admitting physician , Notes from prior ED visits and [place] Controlled Substance Database    Pertinent labs & imaging results that were available during my care of the patient were reviewed by me and considered in my medical decision making (see chart for details).    ____________________________________________   FINAL CLINICAL IMPRESSION(S) / ED DIAGNOSES  Final diagnoses:  Sepsis, due to unspecified organism ([place])  COPD exacerbation ([place])  Influenza A      NEW MEDICATIONS STARTED DURING THIS VISIT:  ED Discharge Orders    None       Note:  This document was prepared using Dragon voice recognition software and may include unintentional dictation errors.    Rudene Re, MD 07/13/17 2203

## 2017-07-13 NOTE — H&P (Signed)
[place] at [place] NAME: Brandon Bender    MR#:  [id number]  DATE OF BIRTH:  Jan 22, 1976  DATE OF ADMISSION:  07/13/2017  PRIMARY CARE PHYSICIAN: Cletis Athens, MD   REQUESTING/REFERRING PHYSICIAN:   CHIEF COMPLAINT:   Chief Complaint  Patient presents with  . Blood Infection    HISTORY OF PRESENT ILLNESS: Brandon Bender  is a 42 y.o. male with a known history of morbid obesity, COPD, hypertension, obstructive sleep apnea. Patient presented to emergency room for acute onset of severe respiratory distress associated with fever, dry cough and wheezing, started in the past 24 hours, progressively getting worse.  His symptoms are worse with exertion.  Patient also had intermittent nausea and one episode of nonbloody nonbilious emesis earlier in the day.  Patient had similar symptoms in the past when he was diagnosed with pneumonia. At the arrival to emergency room, his oxygen saturation was 93% on room air; temperature was 102.1; heart rate was 138; RR 28.  Blood present emergency room showed elevated WBC at 12.3, elevated glucose level at 345, elevated lactic acid at 3.6.  Influenza a test is positive.  Chest x-ray, reviewed by myself, shows acute bronchitis changes. Patient is admitted for further evaluation and treatment.  PAST MEDICAL HISTORY:   Past Medical History:  Diagnosis Date  . COPD (chronic obstructive pulmonary disease) ([place])   . Hypertension   . Sleep apnea     PAST SURGICAL HISTORY:  Past Surgical History:  Procedure Laterality Date  . LESION REMOVAL     back    SOCIAL HISTORY:  Social History   Tobacco Use  . Smoking status: Former Smoker    Packs/day: 1.00    Years: 5.00    Pack years: 5.00    Types: Cigarettes  . Smokeless tobacco: Current User    Types: Chew  Substance Use Topics  . Alcohol use: No    FAMILY HISTORY:  Family History  Problem Relation Age of Onset  . Cancer Mother         ovarian  . Cancer Maternal Grandmother        pancreatic, stomach, liver    DRUG ALLERGIES: No Known Allergies  REVIEW OF SYSTEMS:   CONSTITUTIONAL: Positive for fever, fatigue and generalized weakness.  EYES: No blurred or double vision.  EARS, NOSE, AND THROAT: No tinnitus or ear pain.  RESPIRATORY: Positive for dry cough, shortness of breath, wheezing; no hemoptysis.  CARDIOVASCULAR: No chest pain, orthopnea, edema.  GASTROINTESTINAL: No nausea, vomiting, diarrhea or abdominal pain.  GENITOURINARY: No dysuria, hematuria.  ENDOCRINE: No polyuria, nocturia,  HEMATOLOGY: No bleeding SKIN: No rash or lesion. MUSCULOSKELETAL: Positive for generalized body aches.   NEUROLOGIC: No focal weakness.  PSYCHIATRY: No anxiety or depression.   MEDICATIONS AT HOME:  Prior to Admission medications   Medication Sig Start Date End Date Taking? Authorizing Provider  aspirin 81 MG EC tablet Take 81 mg by mouth daily as needed.    Yes [provider]  Biotin 5000 MCG CAPS Take 1 capsule by mouth daily.   Yes [provider]  cyclobenzaprine (FLEXERIL) 10 MG tablet Take 1 tablet (10 mg total) by mouth 3 (three) times daily as needed for muscle spasms. 06/21/17  Yes Triplett, Cari B, FNP  Multiple Vitamins-Minerals (MULTIVITAMIN WITH MINERALS) tablet Take 1 tablet by mouth daily.   Yes [provider]  naproxen (NAPROSYN) 500 MG tablet Take 1 tablet (500 mg total) by mouth  2 (two) times daily with a meal. Patient taking differently: Take 500 mg by mouth 2 (two) times daily as needed.  06/21/17  Yes Triplett, Cari B, FNP  omega-3 acid ethyl esters (LOVAZA) 1 g capsule Take 1 g by mouth daily.   Yes [provider]  vitamin C (ASCORBIC ACID) 500 MG tablet Take 1,000 mg by mouth daily.   Yes [provider]      PHYSICAL EXAMINATION:   VITAL SIGNS: Blood pressure 131/76, pulse (!) 126, temperature 98.9 F (37.2 C), temperature source Oral, resp. rate 15,  height 5\' 8"  (1.727 m), weight 133.8 kg (295 lb), SpO2 91 %.  GENERAL:  42 y.o.-year-old patient, morbidly obese, lying in the bed, in mild respiratory distress, status post duo nebs and steroids.  EYES: Pupils equal, round, reactive to light and accommodation. No scleral icterus. Extraocular muscles intact.  HEENT: Head atraumatic, normocephalic. Oropharynx and nasopharynx clear.  NECK:  Supple, no jugular venous distention. No thyroid enlargement, no tenderness.  LUNGS: Reduced breath sounds and wheezing, noted bilaterally. No use of accessory muscles of respiration at this time.  CARDIOVASCULAR: S1, S2 normal. No S3/S4.  ABDOMEN: Soft, obese, nontender, nondistended. Bowel sounds present. No organomegaly or mass.  EXTREMITIES: No pedal edema, cyanosis, or clubbing.  NEUROLOGIC:  no focal weakness.  PSYCHIATRIC: The patient is alert and oriented x 3.  SKIN: No obvious rash, lesion, or ulcer.   LABORATORY PANEL:   CBC Recent Labs  Lab 07/13/17 1951  WBC 12.3*  HGB 15.6  HCT 46.3  PLT 207  MCV 86.7  MCH 29.3  MCHC 33.8  RDW 13.2  LYMPHSABS 1.0  MONOABS 0.6  EOSABS 0.0  BASOSABS 0.1   ------------------------------------------------------------------------------------------------------------------  Chemistries  Recent Labs  Lab 07/13/17 1951  NA 137  K 3.9  CL 99*  CO2 26  GLUCOSE 345*  BUN 8  CREATININE 0.81  CALCIUM 9.1  AST 74*  ALT 82*  ALKPHOS 79  BILITOT 0.9   ------------------------------------------------------------------------------------------------------------------ estimated creatinine clearance is 159 mL/min (by C-G formula based on SCr of 0.81 mg/dL). ------------------------------------------------------------------------------------------------------------------ No results for input(s): TSH, T4TOTAL, T3FREE, THYROIDAB in the last 72 hours.  Invalid input(s): FREET3   Coagulation profile Recent Labs  Lab 07/13/17 1951  INR 0.93    ------------------------------------------------------------------------------------------------------------------- No results for input(s): DDIMER in the last 72 hours. -------------------------------------------------------------------------------------------------------------------  Cardiac Enzymes No results for input(s): CKMB, TROPONINI, MYOGLOBIN in the last 168 hours.  Invalid input(s): CK ------------------------------------------------------------------------------------------------------------------ Invalid input(s): POCBNP  ---------------------------------------------------------------------------------------------------------------  Urinalysis    Component Value Date/Time   COLORURINE Yellow 09/04/2012 0616   APPEARANCEUR Clear 09/04/2012 0616   LABSPEC 1.017 09/04/2012 0616   PHURINE 6.0 09/04/2012 0616   GLUCOSEU Negative 09/04/2012 0616   HGBUR Negative 09/04/2012 0616   BILIRUBINUR Negative 09/04/2012 0616   KETONESUR Trace 09/04/2012 0616   PROTEINUR Negative 09/04/2012 0616   NITRITE Negative 09/04/2012 0616   LEUKOCYTESUR Negative 09/04/2012 0616     RADIOLOGY: Dg Chest 2 View  Result Date: 07/13/2017 CLINICAL DATA:  42 year old male with wheezing. EXAM: CHEST - 2 VIEW COMPARISON:  Chest radiograph dated 09/04/2012 FINDINGS: Mild peribronchial densities may represent reactive small airway disease. There is no focal consolidation, pleural effusion, or pneumothorax. The cardiac silhouette is within normal limits. No acute osseous pathology. IMPRESSION: No focal consolidation. Findings may represent reactive small airway disease. Clinical correlation is recommended. Electronically Signed   By: Anner Crete M.D.   On: 07/13/2017 21:40    EKG: Orders placed or  performed during the hospital encounter of 07/13/17  . EKG 12-Lead  . EKG 12-Lead  . ED EKG  . ED EKG    IMPRESSION AND PLAN:  1.  Sepsis, likely secondary to acute respiratory infection.   Will start IV antibiotics and IV fluids,Tamiflu, IV steroids and duo nebs.  Continue oxygen treatment as needed, to maintain oxygen saturation above 93%.  Follow lactic acid level. 2.  Acute hypoxemic respiratory failure, secondary to acute COPD exacerbation and flu.  See treatment above under #1. 3.  Acute COPD exacerbation, likely secondary to acute bronchitis.  We will treat with IV steroids, nebulizer treatments, and oxygen therapy. 4.  Acute bronchitis, will start IV ceftriaxone and doxycycline continue oxygen therapy, duo nebs  And IV steroids. 5.  Influenza A, will start Tamiflu.  Continue supportive measures with IV fluids, and medications for nausea, fever and pain. 6.  Diabetes type 2 new diagnosis.  We will start patient on insulin treatment while in the hospital and continue to monitor blood sugars before meals and at bedtime.  Low-carb diet and exercise regimen was discussed with patient in detail.  He would likely do well on p.o. medications at discharge.  7.  Obstructive sleep apnea, on CPAP at night.   All the records are reviewed and case discussed with ED provider. Management plans discussed with the patient and he is in agreement.  CODE STATUS: FULL    TOTAL TIME TAKING CARE OF THIS PATIENT: 50 minutes.    Amelia Jo M.D on 07/13/2017 at 10:54 PM  Between 7am to 6pm - Pager - [phone]  After 6pm go to [email or web address] - password [email or web address] [facility]  Office  [phone]  CC: Primary care physician; Cletis Athens, MD

## 2017-07-13 NOTE — ED Triage Notes (Signed)
Pt arrives ambulatory to triage with c/o of SOB which started today. Per pt, he was out yesterday in the sun all day moving tree branches. Pt reports that his concerns started this AM. Pt has noted wheezing to both sides, febrile, and tachycardic at this time.

## 2017-07-14 ENCOUNTER — Other Ambulatory Visit: Payer: Self-pay

## 2017-07-14 LAB — URINALYSIS, COMPLETE (UACMP) WITH MICROSCOPIC
Bacteria, UA: NONE SEEN
Bilirubin Urine: NEGATIVE
Glucose, UA: >=500 mg/dL — AB
Hgb urine dipstick: NEGATIVE
KETONES UR: 5 mg/dL — AB
Leukocytes, UA: NEGATIVE
Nitrite: NEGATIVE
PROTEIN: 30 mg/dL — AB
Specific Gravity, Urine: 1.037 — ABNORMAL HIGH (ref 1.005–1.030)
Squamous Epithelial / LPF: NONE SEEN
pH: 6 (ref 5.0–8.0)

## 2017-07-14 LAB — GLUCOSE, CAPILLARY
GLUCOSE-CAPILLARY: 179 mg/dL — AB (ref 65–99)
Glucose-Capillary: 188 mg/dL — ABNORMAL HIGH (ref 65–99)
Glucose-Capillary: 285 mg/dL — ABNORMAL HIGH (ref 65–99)
Glucose-Capillary: 294 mg/dL — ABNORMAL HIGH (ref 65–99)
Glucose-Capillary: 328 mg/dL — ABNORMAL HIGH (ref 65–99)

## 2017-07-14 LAB — CBC
HEMATOCRIT: 42.5 % (ref 40.0–52.0)
HEMOGLOBIN: 14.2 g/dL (ref 13.0–18.0)
MCH: 29.4 pg (ref 26.0–34.0)
MCHC: 33.5 g/dL (ref 32.0–36.0)
MCV: 87.7 fL (ref 80.0–100.0)
Platelets: 182 10*3/uL (ref 150–440)
RBC: 4.84 MIL/uL (ref 4.40–5.90)
RDW: 13 % (ref 11.5–14.5)
WBC: 11.1 10*3/uL — ABNORMAL HIGH (ref 3.8–10.6)

## 2017-07-14 LAB — LACTIC ACID, PLASMA
Lactic Acid, Venous: 3.5 mmol/L (ref 0.5–1.9)
Lactic Acid, Venous: 3.3 mmol/L (ref 0.5–1.9)
Lactic Acid, Venous: 2.9 mmol/L (ref 0.5–1.9)
Lactic Acid, Venous: 2.3 mmol/L (ref 0.5–1.9)
Lactic Acid, Venous: 3.2 mmol/L (ref 0.5–1.9)

## 2017-07-14 LAB — BASIC METABOLIC PANEL
ANION GAP: 11 (ref 5–15)
BUN: 9 mg/dL (ref 6–20)
CHLORIDE: 105 mmol/L (ref 101–111)
CO2: 23 mmol/L (ref 22–32)
Calcium: 8.2 mg/dL — ABNORMAL LOW (ref 8.9–10.3)
Creatinine, Ser: 0.84 mg/dL (ref 0.61–1.24)
GFR calc Af Amer: >60 mL/min (ref >60)
GFR calc non Af Amer: >60 mL/min (ref >60)
GLUCOSE: 339 mg/dL — AB (ref 65–99)
POTASSIUM: 4.1 mmol/L (ref 3.5–5.1)
Sodium: 139 mmol/L (ref 135–145)

## 2017-07-14 MED ORDER — DOXYCYCLINE HYCLATE 100 MG PO TABS
100.0000 mg | ORAL_TABLET | Freq: Two times a day (BID) | ORAL | Status: DC
  Administered 2017-07-14 – 2017-07-15 (×3): 100 mg via ORAL
  Filled 2017-07-14 (×3): qty 1

## 2017-07-14 MED ORDER — SODIUM CHLORIDE 0.9 % IV BOLUS
1000.0000 mL | Freq: Once | INTRAVENOUS | Status: AC
  Administered 2017-07-14: 1000 mL via INTRAVENOUS

## 2017-07-14 MED ORDER — INSULIN GLARGINE 100 UNIT/ML ~~LOC~~ SOLN
8.0000 [IU] | Freq: Every day | SUBCUTANEOUS | Status: DC
  Administered 2017-07-14 – 2017-07-15 (×2): 8 [IU] via SUBCUTANEOUS
  Filled 2017-07-14 (×3): qty 0.08

## 2017-07-14 MED ORDER — VITAMIN C 500 MG PO TABS
1000.0000 mg | ORAL_TABLET | Freq: Every day | ORAL | Status: DC
Start: 2017-07-14 — End: 2017-07-15
  Administered 2017-07-14: 1000 mg via ORAL
  Filled 2017-07-14 (×2): qty 2

## 2017-07-14 MED ORDER — ADULT MULTIVITAMIN W/MINERALS CH
1.0000 | ORAL_TABLET | Freq: Every day | ORAL | Status: DC
  Administered 2017-07-14: 1 via ORAL
  Filled 2017-07-14: qty 1

## 2017-07-14 MED ORDER — PREDNISONE 50 MG PO TABS
50.0000 mg | ORAL_TABLET | Freq: Every day | ORAL | Status: DC
  Administered 2017-07-15: 50 mg via ORAL
  Filled 2017-07-14: qty 1

## 2017-07-14 MED ORDER — BENZONATATE 100 MG PO CAPS
200.0000 mg | ORAL_CAPSULE | Freq: Three times a day (TID) | ORAL | Status: DC | PRN
  Administered 2017-07-14: 200 mg via ORAL
  Filled 2017-07-14: qty 2

## 2017-07-14 NOTE — Progress Notes (Signed)
[place] at [place] NAME: Brandon Bender    MR#:  [id number]  DATE OF BIRTH:  Nov 25, 1975  SUBJECTIVE:  CHIEF COMPLAINT:   Chief Complaint  Patient presents with  . Blood Infection  Patient is feeling better today, no events overnight per nursing staff  REVIEW OF SYSTEMS:  CONSTITUTIONAL: No fever, fatigue or weakness.  EYES: No blurred or double vision.  EARS, NOSE, AND THROAT: No tinnitus or ear pain.  RESPIRATORY: No cough, shortness of breath, wheezing or hemoptysis.  CARDIOVASCULAR: No chest pain, orthopnea, edema.  GASTROINTESTINAL: No nausea, vomiting, diarrhea or abdominal pain.  GENITOURINARY: No dysuria, hematuria.  ENDOCRINE: No polyuria, nocturia,  HEMATOLOGY: No anemia, easy bruising or bleeding SKIN: No rash or lesion. MUSCULOSKELETAL: No joint pain or arthritis.   NEUROLOGIC: No tingling, numbness, weakness.  PSYCHIATRY: No anxiety or depression.   ROS  DRUG ALLERGIES:  No Known Allergies  VITALS:  Blood pressure (!) 141/83, pulse (!) 113, temperature 99.2 F (37.3 C), temperature source Oral, resp. rate 18, height 5\' 7"  (1.702 m), weight (!) 136.4 kg (300 lb 9.6 oz), SpO2 94 %.  PHYSICAL EXAMINATION:  GENERAL:  42 y.o.-year-old patient lying in the bed with no acute distress.  EYES: Pupils equal, round, reactive to light and accommodation. No scleral icterus. Extraocular muscles intact.  HEENT: Head atraumatic, normocephalic. Oropharynx and nasopharynx clear.  NECK:  Supple, no jugular venous distention. No thyroid enlargement, no tenderness.  LUNGS: Normal breath sounds bilaterally, no wheezing, rales,rhonchi or crepitation. No use of accessory muscles of respiration.  CARDIOVASCULAR: S1, S2 normal. No murmurs, rubs, or gallops.  ABDOMEN: Soft, nontender, nondistended. Bowel sounds present. No organomegaly or mass.  EXTREMITIES: No pedal edema, cyanosis, or clubbing.  NEUROLOGIC: Cranial nerves II through XII  are intact. Muscle strength 5/5 in all extremities. Sensation intact. Gait not checked.  PSYCHIATRIC: The patient is alert and oriented x 3.  SKIN: No obvious rash, lesion, or ulcer.   Physical Exam LABORATORY PANEL:   CBC Recent Labs  Lab 07/14/17 0132  WBC 11.1*  HGB 14.2  HCT 42.5  PLT 182   ------------------------------------------------------------------------------------------------------------------  Chemistries  Recent Labs  Lab 07/13/17 1951 07/14/17 0132  NA 137 139  K 3.9 4.1  CL 99* 105  CO2 26 23  GLUCOSE 345* 339*  BUN 8 9  CREATININE 0.81 0.84  CALCIUM 9.1 8.2*  AST 74*  --   ALT 82*  --   ALKPHOS 79  --   BILITOT 0.9  --    ------------------------------------------------------------------------------------------------------------------  Cardiac Enzymes No results for input(s): TROPONINI in the last 168 hours. ------------------------------------------------------------------------------------------------------------------  RADIOLOGY:  Dg Chest 2 View  Result Date: 07/13/2017 CLINICAL DATA:  42 year old male with wheezing. EXAM: CHEST - 2 VIEW COMPARISON:  Chest radiograph dated 09/04/2012 FINDINGS: Mild peribronchial densities may represent reactive small airway disease. There is no focal consolidation, pleural effusion, or pneumothorax. The cardiac silhouette is within normal limits. No acute osseous pathology. IMPRESSION: No focal consolidation. Findings may represent reactive small airway disease. Clinical correlation is recommended. Electronically Signed   By: Anner Crete M.D.   On: 07/13/2017 21:40    ASSESSMENT AND PLAN:  1 acute sepsis secondary to acute influenza Resolved Continue IV fluids for rehydration  2 acute influenza type a Stable Continue supportive care, Tamiflu for 5-day course, and IV fluids for rehydration  3 acute on chronic obstructive pulmonary disease exacerbation Resolving Discontinue IV Solu-Medrol, start  prednisone taper, breathing treatments  as needed, mucolytic agents, and continue close medical monitoring  4 chronic morbid obesity  most likely secondary to excess calories Lifestyle modification recommended  5 chronic diabetes mellitus type 2 with hyperglycemia  Exacerbated by steroids  IV Solu-Medrol will be discontinued, increase sliding scale insulin, Accu-Cheks per routine, ADA diet   6 chronic obstructive sleep apnea  Stable  Continue CPAP at bedtime/as needed    All the records are reviewed and case discussed with Care Management/Social Workerr. Management plans discussed with the patient, family and they are in agreement.  CODE STATUS: full  TOTAL TIME TAKING CARE OF THIS PATIENT: 35 minutes.     POSSIBLE D/C IN 1-2 DAYS, DEPENDING ON CLINICAL CONDITION.   Avel Peace Salary M.D on 07/14/2017   Between 7am to 6pm - Pager - [phone]  After 6pm go to [email or web address] - password [email or web address] [facility]  Office  [phone]  CC: Primary care physician; Cletis Athens, MD  Note: This dictation was prepared with Dragon dictation along with smaller phrase technology. Any transcriptional errors that result from this process are unintentional.

## 2017-07-14 NOTE — Progress Notes (Signed)
Pt refused to wear Cpap tonight. No distress noted. Will continue to monitor pt.

## 2017-07-14 NOTE — Progress Notes (Addendum)
Pt Lactic Acid 3.3. Primary Nurse paged and spoke to Dr. Jerelyn Charles. No new orders. Primary nurse to continue to monitor.

## 2017-07-14 NOTE — Progress Notes (Signed)
Pt Lactic Acid elevated on last lab draw at 3.4. Pt has received 3 bolus of NS. Primary nurse paged and spoke to spoke to Dr. Duane Boston. Orders received for  Lactic Acid to be drawn once more. Primary nurse to continue to monitor.

## 2017-07-14 NOTE — Progress Notes (Signed)
Primary nurse was notified by lab of critical Lactic Acid at 3.5. Primary nurse paged and spoke to Dr. Duane Boston. Orders received to recheck pt lactic in 3 hours. Primary nurse to continue to monitor.

## 2017-07-15 LAB — BASIC METABOLIC PANEL
Anion gap: 10 (ref 5–15)
BUN: 13 mg/dL (ref 6–20)
CHLORIDE: 105 mmol/L (ref 101–111)
CO2: 22 mmol/L (ref 22–32)
CREATININE: 0.77 mg/dL (ref 0.61–1.24)
Calcium: 8.1 mg/dL — ABNORMAL LOW (ref 8.9–10.3)
GFR calc non Af Amer: >60 mL/min (ref >60)
GLUCOSE: 247 mg/dL — AB (ref 65–99)
Potassium: 3.6 mmol/L (ref 3.5–5.1)
Sodium: 137 mmol/L (ref 135–145)

## 2017-07-15 LAB — CBC WITH DIFFERENTIAL/PLATELET
BASOS ABS: 0.1 10*3/uL (ref 0–0.1)
BASOS PCT: 1 %
EOS PCT: 0 %
Eosinophils Absolute: 0 10*3/uL (ref 0–0.7)
HCT: 39.1 % — ABNORMAL LOW (ref 40.0–52.0)
Hemoglobin: 13.8 g/dL (ref 13.0–18.0)
LYMPHS ABS: 1.4 10*3/uL (ref 1.0–3.6)
Lymphocytes Relative: 11 %
MCH: 30.6 pg (ref 26.0–34.0)
MCHC: 35.2 g/dL (ref 32.0–36.0)
MCV: 86.9 fL (ref 80.0–100.0)
Monocytes Absolute: 1 10*3/uL (ref 0.2–1.0)
Monocytes Relative: 8 %
NEUTROS PCT: 80 %
Neutro Abs: 10 10*3/uL — ABNORMAL HIGH (ref 1.4–6.5)
PLATELETS: 198 10*3/uL (ref 150–440)
RBC: 4.5 MIL/uL (ref 4.40–5.90)
RDW: 13.6 % (ref 11.5–14.5)
WBC: 12.5 10*3/uL — ABNORMAL HIGH (ref 3.8–10.6)

## 2017-07-15 LAB — LACTIC ACID, PLASMA: Lactic Acid, Venous: 1.3 mmol/L (ref 0.5–1.9)

## 2017-07-15 LAB — GLUCOSE, CAPILLARY: GLUCOSE-CAPILLARY: 243 mg/dL — AB (ref 65–99)

## 2017-07-15 MED ORDER — PREDNISONE 50 MG PO TABS: ORAL_TABLET | ORAL | 0 refills | Status: DC

## 2017-07-15 MED ORDER — DOXYCYCLINE HYCLATE 100 MG PO TABS: 100.0000 mg | ORAL_TABLET | Freq: Two times a day (BID) | ORAL | 0 refills | Status: DC

## 2017-07-15 MED ORDER — BENZONATATE 200 MG PO CAPS: 200.0000 mg | ORAL_CAPSULE | Freq: Three times a day (TID) | ORAL | 0 refills | Status: DC | PRN

## 2017-07-15 MED ORDER — ALBUTEROL SULFATE HFA 108 (90 BASE) MCG/ACT IN AERS: 2.0000 | INHALATION_SPRAY | RESPIRATORY_TRACT | 0 refills | Status: AC | PRN

## 2017-07-15 MED ORDER — OSELTAMIVIR PHOSPHATE 75 MG PO CAPS: 75.0000 mg | ORAL_CAPSULE | Freq: Two times a day (BID) | ORAL | 0 refills | Status: DC

## 2017-07-15 MED ORDER — ACETAMINOPHEN 325 MG PO TABS: 650.0000 mg | ORAL_TABLET | Freq: Four times a day (QID) | ORAL | 0 refills | Status: AC | PRN

## 2017-07-15 NOTE — Discharge Summary (Signed)
[facility] NAME: Brandon Bender    MR#:  [id number]  DATE OF BIRTH:  [birth year]  DATE OF ADMISSION:  07/13/2017 ADMITTING PHYSICIAN: Amelia Jo, MD  DATE OF DISCHARGE: No discharge date for patient encounter.  PRIMARY CARE PHYSICIAN: Cletis Athens, MD    ADMISSION DIAGNOSIS:  Influenza A [J10.1] COPD exacerbation ([place]) [J44.1] Sepsis, due to unspecified organism ([place]) [A41.9]  DISCHARGE DIAGNOSIS:  Active Problems:   Sepsis ([place])   SECONDARY DIAGNOSIS:   Past Medical History:  Diagnosis Date  . COPD (chronic obstructive pulmonary disease) ([place])   . Hypertension   . Sleep apnea     HOSPITAL COURSE:  1 acute sepsis secondary to acute influenza Resolved Treated with IV fluids for rehydration  2 acute influenza type a Resolving Provided supportive care, Tamiflu for 5-day course, IV fluids for rehydration, and patient did well  3 acute on chronic obstructive pulmonary disease exacerbation Resolved Treated with short course IV Solu-Medrol, on prednisone taper, provided breathing treatments as needed, and patient did well   4 chronic morbid obesity  Stable most likely secondary to excess calories Lifestyle modification recommended  5 chronic diet-controlled diabetes mellitus type 2 with hyperglycemia  Exacerbated by steroids  Treated with short course IV Solu-Medrol, provided sliding scale insulin, Accu-Cheks per routine, ADA diet, and patient did well   6 chronic obstructive sleep apnea  Stable  Continued CPAP at bedtime/as needed   DISCHARGE CONDITIONS:  On the day of discharge patient is hemodynamically stable, ambulating without difficulty, tolerating a diet, successfully weaned off oxygen, ready for discharge to home with appropriate follow-up, for more specific details please see chart  CONSULTS OBTAINED:    DRUG ALLERGIES:  No Known Allergies  DISCHARGE MEDICATIONS:   Allergies  as of 07/15/2017   No Known Allergies     Medication List    TAKE these medications   acetaminophen 325 MG tablet Commonly known as:  TYLENOL Take 2 tablets (650 mg total) by mouth every 6 (six) hours as needed for mild pain (or Fever >/= 101).   albuterol 108 (90 Base) MCG/ACT inhaler Commonly known as:  PROVENTIL HFA;VENTOLIN HFA Inhale 2 puffs into the lungs every 4 (four) hours as needed for wheezing or shortness of breath.   aspirin 81 MG EC tablet Take 81 mg by mouth daily as needed.   benzonatate 200 MG capsule Commonly known as:  TESSALON Take 1 capsule (200 mg total) by mouth 3 (three) times daily as needed for cough.   Biotin 5000 MCG Caps Take 1 capsule by mouth daily.   cyclobenzaprine 10 MG tablet Commonly known as:  FLEXERIL Take 1 tablet (10 mg total) by mouth 3 (three) times daily as needed for muscle spasms.   doxycycline 100 MG tablet Commonly known as:  VIBRA-TABS Take 1 tablet (100 mg total) by mouth every 12 (twelve) hours.   multivitamin with minerals tablet Take 1 tablet by mouth daily.   naproxen 500 MG tablet Commonly known as:  NAPROSYN Take 1 tablet (500 mg total) by mouth 2 (two) times daily with a meal. What changed:    when to take this  reasons to take this   omega-3 acid ethyl esters 1 g capsule Commonly known as:  LOVAZA Take 1 g by mouth daily.   oseltamivir 75 MG capsule Commonly known as:  TAMIFLU Take 1 capsule (75 mg total) by mouth 2 (two) times daily.   predniSONE 50 MG tablet  Commonly known as:  DELTASONE 1 po daily Start taking on:  07/16/2017   vitamin C 500 MG tablet Commonly known as:  ASCORBIC ACID Take 1,000 mg by mouth daily.        DISCHARGE INSTRUCTIONS:   If you experience worsening of your admission symptoms, develop shortness of breath, life threatening emergency, suicidal or homicidal thoughts you must seek medical attention immediately by calling 911 or calling your MD immediately  if symptoms less  severe.  You Must read complete instructions/literature along with all the possible adverse reactions/side effects for all the Medicines you take and that have been prescribed to you. Take any new Medicines after you have completely understood and accept all the possible adverse reactions/side effects.   Please note  You were cared for by a hospitalist during your hospital stay. If you have any questions about your discharge medications or the care you received while you were in the hospital after you are discharged, you can call the unit and asked to speak with the hospitalist on call if the hospitalist that took care of you is not available. Once you are discharged, your primary care physician will handle any further medical issues. Please note that NO REFILLS for any discharge medications will be authorized once you are discharged, as it is imperative that you return to your primary care physician (or establish a relationship with a primary care physician if you do not have one) for your aftercare needs so that they can reassess your need for medications and monitor your lab values.    Today   CHIEF COMPLAINT:   Chief Complaint  Patient presents with  . Blood Infection    HISTORY OF PRESENT ILLNESS:  42 y.o. male with a known history of morbid obesity, COPD, hypertension, obstructive sleep apnea. Patient presented to emergency room for acute onset of severe respiratory distress associated with fever, dry cough and wheezing, started in the past 24 hours, progressively getting worse.  His symptoms are worse with exertion.  Patient also had intermittent nausea and one episode of nonbloody nonbilious emesis earlier in the day.  Patient had similar symptoms in the past when he was diagnosed with pneumonia. At the arrival to emergency room, his oxygen saturation was 93% on room air; temperature was 102.1; heart rate was 138; RR 28.  Blood present emergency room showed elevated WBC at 12.3,  elevated glucose level at 345, elevated lactic acid at 3.6.  Influenza a test is positive.  Chest x-ray, reviewed by myself, shows acute bronchitis changes. Patient is admitted for further evaluation and treatment.    VITAL SIGNS:  Blood pressure 131/67, pulse (!) 120, temperature 99.2 F (37.3 C), temperature source Oral, resp. rate (!) 21, height 5\' 7"  (1.702 m), weight (!) 136.4 kg (300 lb 9.6 oz), SpO2 93 %.  I/O:    Intake/Output Summary (Last 24 hours) at 07/15/2017 0940 Last data filed at 07/15/2017 0800 Gross per 24 hour  Intake 3088 ml  Output 1500 ml  Net 1588 ml    PHYSICAL EXAMINATION:  GENERAL:  42 y.o.-year-old patient lying in the bed with no acute distress.  EYES: Pupils equal, round, reactive to light and accommodation. No scleral icterus. Extraocular muscles intact.  HEENT: Head atraumatic, normocephalic. Oropharynx and nasopharynx clear.  NECK:  Supple, no jugular venous distention. No thyroid enlargement, no tenderness.  LUNGS: Normal breath sounds bilaterally, no wheezing, rales,rhonchi or crepitation. No use of accessory muscles of respiration.  CARDIOVASCULAR: S1, S2 normal. No murmurs,  rubs, or gallops.  ABDOMEN: Soft, non-tender, non-distended. Bowel sounds present. No organomegaly or mass.  EXTREMITIES: No pedal edema, cyanosis, or clubbing.  NEUROLOGIC: Cranial nerves II through XII are intact. Muscle strength 5/5 in all extremities. Sensation intact. Gait not checked.  PSYCHIATRIC: The patient is alert and oriented x 3.  SKIN: No obvious rash, lesion, or ulcer.   DATA REVIEW:   CBC Recent Labs  Lab 07/15/17 0328  WBC 12.5*  HGB 13.8  HCT 39.1*  PLT 198    Chemistries  Recent Labs  Lab 07/13/17 1951  07/15/17 0328  NA 137   < > 137  K 3.9   < > 3.6  CL 99*   < > 105  CO2 26   < > 22  GLUCOSE 345*   < > 247*  BUN 8   < > 13  CREATININE 0.81   < > 0.77  CALCIUM 9.1   < > 8.1*  AST 74*  --   --   ALT 82*  --   --   ALKPHOS 79  --   --    BILITOT 0.9  --   --    < > = values in this interval not displayed.    Cardiac Enzymes No results for input(s): TROPONINI in the last 168 hours.  Microbiology Results  Results for orders placed or performed during the hospital encounter of 07/13/17  Culture, blood (Routine x 2)     Status: None (Preliminary result)   Collection Time: 07/13/17  7:51 PM  Result Value Ref Range Status   Specimen Description   Final    BLOOD Blood Culture results may not be optimal due to an excessive volume of blood received in culture bottles   Special Requests   Final    BOTTLES DRAWN AEROBIC AND ANAEROBIC RIGHT ANTECUBITAL   Culture   Final    NO GROWTH 2 DAYS Performed at [facility], [address], [place], [place] [address]    Report Status PENDING  Incomplete  Culture, blood (Routine x 2)     Status: None (Preliminary result)   Collection Time: 07/13/17  8:46 PM  Result Value Ref Range Status   Specimen Description BLOOD LEFT ANTECUBITAL  Final   Special Requests   Final    BOTTLES DRAWN AEROBIC AND ANAEROBIC Blood Culture adequate volume   Culture   Final    NO GROWTH 2 DAYS Performed at [facility], [address] [place], [place] [address]    Report Status PENDING  Incomplete    RADIOLOGY:  Dg Chest 2 View  Result Date: 07/13/2017 CLINICAL DATA:  42 year old male with wheezing. EXAM: CHEST - 2 VIEW COMPARISON:  Chest radiograph dated 09/04/2012 FINDINGS: Mild peribronchial densities may represent reactive small airway disease. There is no focal consolidation, pleural effusion, or pneumothorax. The cardiac silhouette is within normal limits. No acute osseous pathology. IMPRESSION: No focal consolidation. Findings may represent reactive small airway disease. Clinical correlation is recommended. Electronically Signed   By: Anner Crete M.D.   On: 07/13/2017 21:40    EKG:   Orders placed or performed during the hospital encounter of 07/13/17  . EKG 12-Lead   . EKG 12-Lead  . ED EKG  . ED EKG      Management plans discussed with the patient, family and they are in agreement.  CODE STATUS:     Code Status Orders  (From admission, onward)  Start     Ordered   07/13/17 2356  Full code  Continuous     07/13/17 2355    Code Status History    This patient has a current code status but no historical code status.      TOTAL TIME TAKING CARE OF THIS PATIENT: 45 minutes.     Avel Peace Salary M.D on 07/15/2017 at 9:40 AM  Between 7am to 6pm - Pager - [phone]  After 6pm go to [email or web address] - password [email or web address] [facility]  Office  [phone]  CC: Primary care physician; Cletis Athens, MD   Note: This dictation was prepared with Dragon dictation along with smaller phrase technology. Any transcriptional errors that result from this process are unintentional.

## 2017-07-15 NOTE — Progress Notes (Signed)
IV and tele were removed. Discharge instructions, follow-up appointments, and prescriptions were provided to the pt. All questions answered. The pt was taken downstairs via wheelchair by volunteers.

## 2017-07-16 LAB — HIV ANTIBODY (ROUTINE TESTING W REFLEX): HIV SCREEN 4TH GENERATION: NONREACTIVE

## 2017-07-18 DIAGNOSIS — G4733 Obstructive sleep apnea (adult) (pediatric): Secondary | ICD-10-CM | POA: Diagnosis not present

## 2017-07-18 DIAGNOSIS — J1100 Influenza due to unidentified influenza virus with unspecified type of pneumonia: Secondary | ICD-10-CM | POA: Diagnosis not present

## 2017-07-18 DIAGNOSIS — E669 Obesity, unspecified: Secondary | ICD-10-CM | POA: Diagnosis not present

## 2017-07-18 DIAGNOSIS — F5101 Primary insomnia: Secondary | ICD-10-CM | POA: Diagnosis not present

## 2017-07-18 LAB — CULTURE, BLOOD (ROUTINE X 2)
Culture: NO GROWTH
Culture: NO GROWTH
Special Requests: ADEQUATE

## 2017-08-22 DIAGNOSIS — G4733 Obstructive sleep apnea (adult) (pediatric): Secondary | ICD-10-CM | POA: Diagnosis not present

## 2017-08-22 DIAGNOSIS — F5101 Primary insomnia: Secondary | ICD-10-CM | POA: Diagnosis not present

## 2017-08-22 DIAGNOSIS — E669 Obesity, unspecified: Secondary | ICD-10-CM | POA: Diagnosis not present

## 2017-08-27 DIAGNOSIS — C61 Malignant neoplasm of prostate: Secondary | ICD-10-CM | POA: Diagnosis not present

## 2017-08-27 DIAGNOSIS — E034 Atrophy of thyroid (acquired): Secondary | ICD-10-CM | POA: Diagnosis not present

## 2017-08-27 DIAGNOSIS — E119 Type 2 diabetes mellitus without complications: Secondary | ICD-10-CM | POA: Diagnosis not present

## 2017-08-27 DIAGNOSIS — E7849 Other hyperlipidemia: Secondary | ICD-10-CM | POA: Diagnosis not present

## 2017-08-27 DIAGNOSIS — I10 Essential (primary) hypertension: Secondary | ICD-10-CM | POA: Diagnosis not present

## 2017-09-05 DIAGNOSIS — E119 Type 2 diabetes mellitus without complications: Secondary | ICD-10-CM | POA: Diagnosis not present

## 2017-09-05 DIAGNOSIS — E118 Type 2 diabetes mellitus with unspecified complications: Secondary | ICD-10-CM | POA: Diagnosis not present

## 2017-09-05 DIAGNOSIS — E669 Obesity, unspecified: Secondary | ICD-10-CM | POA: Diagnosis not present

## 2017-09-05 DIAGNOSIS — I10 Essential (primary) hypertension: Secondary | ICD-10-CM | POA: Diagnosis not present

## 2017-09-25 DIAGNOSIS — W010XXA Fall on same level from slipping, tripping and stumbling without subsequent striking against object, initial encounter: Secondary | ICD-10-CM | POA: Diagnosis not present

## 2017-09-25 DIAGNOSIS — S99921A Unspecified injury of right foot, initial encounter: Secondary | ICD-10-CM | POA: Diagnosis not present

## 2017-09-25 DIAGNOSIS — M19071 Primary osteoarthritis, right ankle and foot: Secondary | ICD-10-CM | POA: Diagnosis not present

## 2017-12-11 DIAGNOSIS — G4733 Obstructive sleep apnea (adult) (pediatric): Secondary | ICD-10-CM | POA: Diagnosis not present

## 2017-12-11 DIAGNOSIS — J45909 Unspecified asthma, uncomplicated: Secondary | ICD-10-CM | POA: Diagnosis not present

## 2017-12-11 DIAGNOSIS — I10 Essential (primary) hypertension: Secondary | ICD-10-CM | POA: Diagnosis not present

## 2017-12-11 DIAGNOSIS — E119 Type 2 diabetes mellitus without complications: Secondary | ICD-10-CM | POA: Diagnosis not present

## 2017-12-23 DIAGNOSIS — J40 Bronchitis, not specified as acute or chronic: Secondary | ICD-10-CM | POA: Diagnosis not present

## 2017-12-23 DIAGNOSIS — E119 Type 2 diabetes mellitus without complications: Secondary | ICD-10-CM | POA: Diagnosis not present

## 2017-12-23 DIAGNOSIS — E118 Type 2 diabetes mellitus with unspecified complications: Secondary | ICD-10-CM | POA: Diagnosis not present

## 2017-12-23 DIAGNOSIS — I10 Essential (primary) hypertension: Secondary | ICD-10-CM | POA: Diagnosis not present

## 2017-12-23 DIAGNOSIS — J45909 Unspecified asthma, uncomplicated: Secondary | ICD-10-CM | POA: Diagnosis not present

## 2017-12-30 DIAGNOSIS — E119 Type 2 diabetes mellitus without complications: Secondary | ICD-10-CM | POA: Diagnosis not present

## 2017-12-30 DIAGNOSIS — I10 Essential (primary) hypertension: Secondary | ICD-10-CM | POA: Diagnosis not present

## 2017-12-30 DIAGNOSIS — J45909 Unspecified asthma, uncomplicated: Secondary | ICD-10-CM | POA: Diagnosis not present

## 2017-12-30 DIAGNOSIS — E118 Type 2 diabetes mellitus with unspecified complications: Secondary | ICD-10-CM | POA: Diagnosis not present

## 2018-01-24 DIAGNOSIS — Z23 Encounter for immunization: Secondary | ICD-10-CM | POA: Diagnosis not present

## 2018-02-10 DIAGNOSIS — E118 Type 2 diabetes mellitus with unspecified complications: Secondary | ICD-10-CM | POA: Diagnosis not present

## 2018-02-10 DIAGNOSIS — E119 Type 2 diabetes mellitus without complications: Secondary | ICD-10-CM | POA: Diagnosis not present

## 2018-02-10 DIAGNOSIS — I10 Essential (primary) hypertension: Secondary | ICD-10-CM | POA: Diagnosis not present

## 2018-02-10 DIAGNOSIS — J45909 Unspecified asthma, uncomplicated: Secondary | ICD-10-CM | POA: Diagnosis not present

## 2018-02-20 DIAGNOSIS — E119 Type 2 diabetes mellitus without complications: Secondary | ICD-10-CM | POA: Diagnosis not present

## 2018-04-07 IMAGING — CR DG CHEST 2V
1 series · 2 of 2 positions shown · non-contrast
Comparison: Chest radiograph dated 09/04/2012

CLINICAL DATA: 41-year-old male with wheezing.

EXAM:
CHEST - 2 VIEW

[Series 1: dg chest 2 view · 0.14mm/px · 2 of 2 slices shown]
[im 1/2]
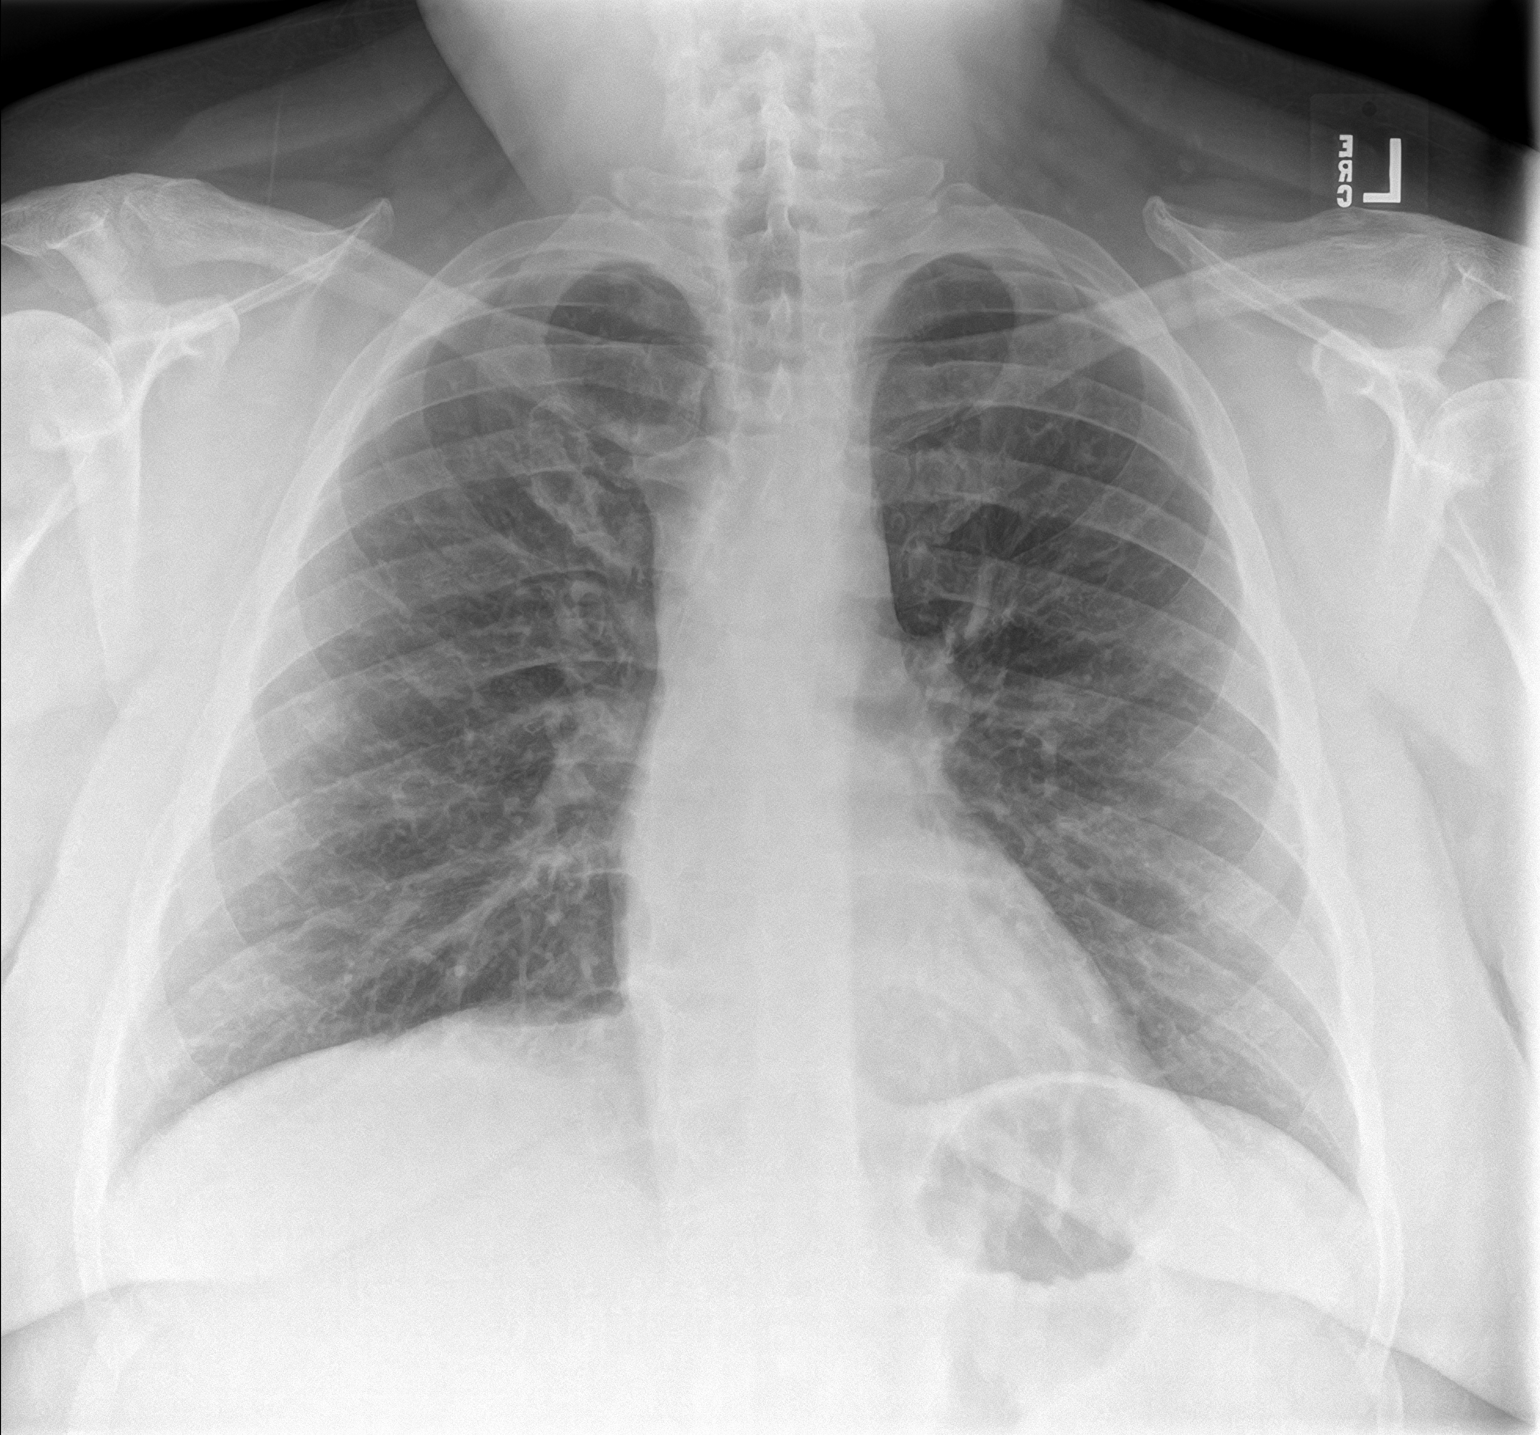
[im 2/2]
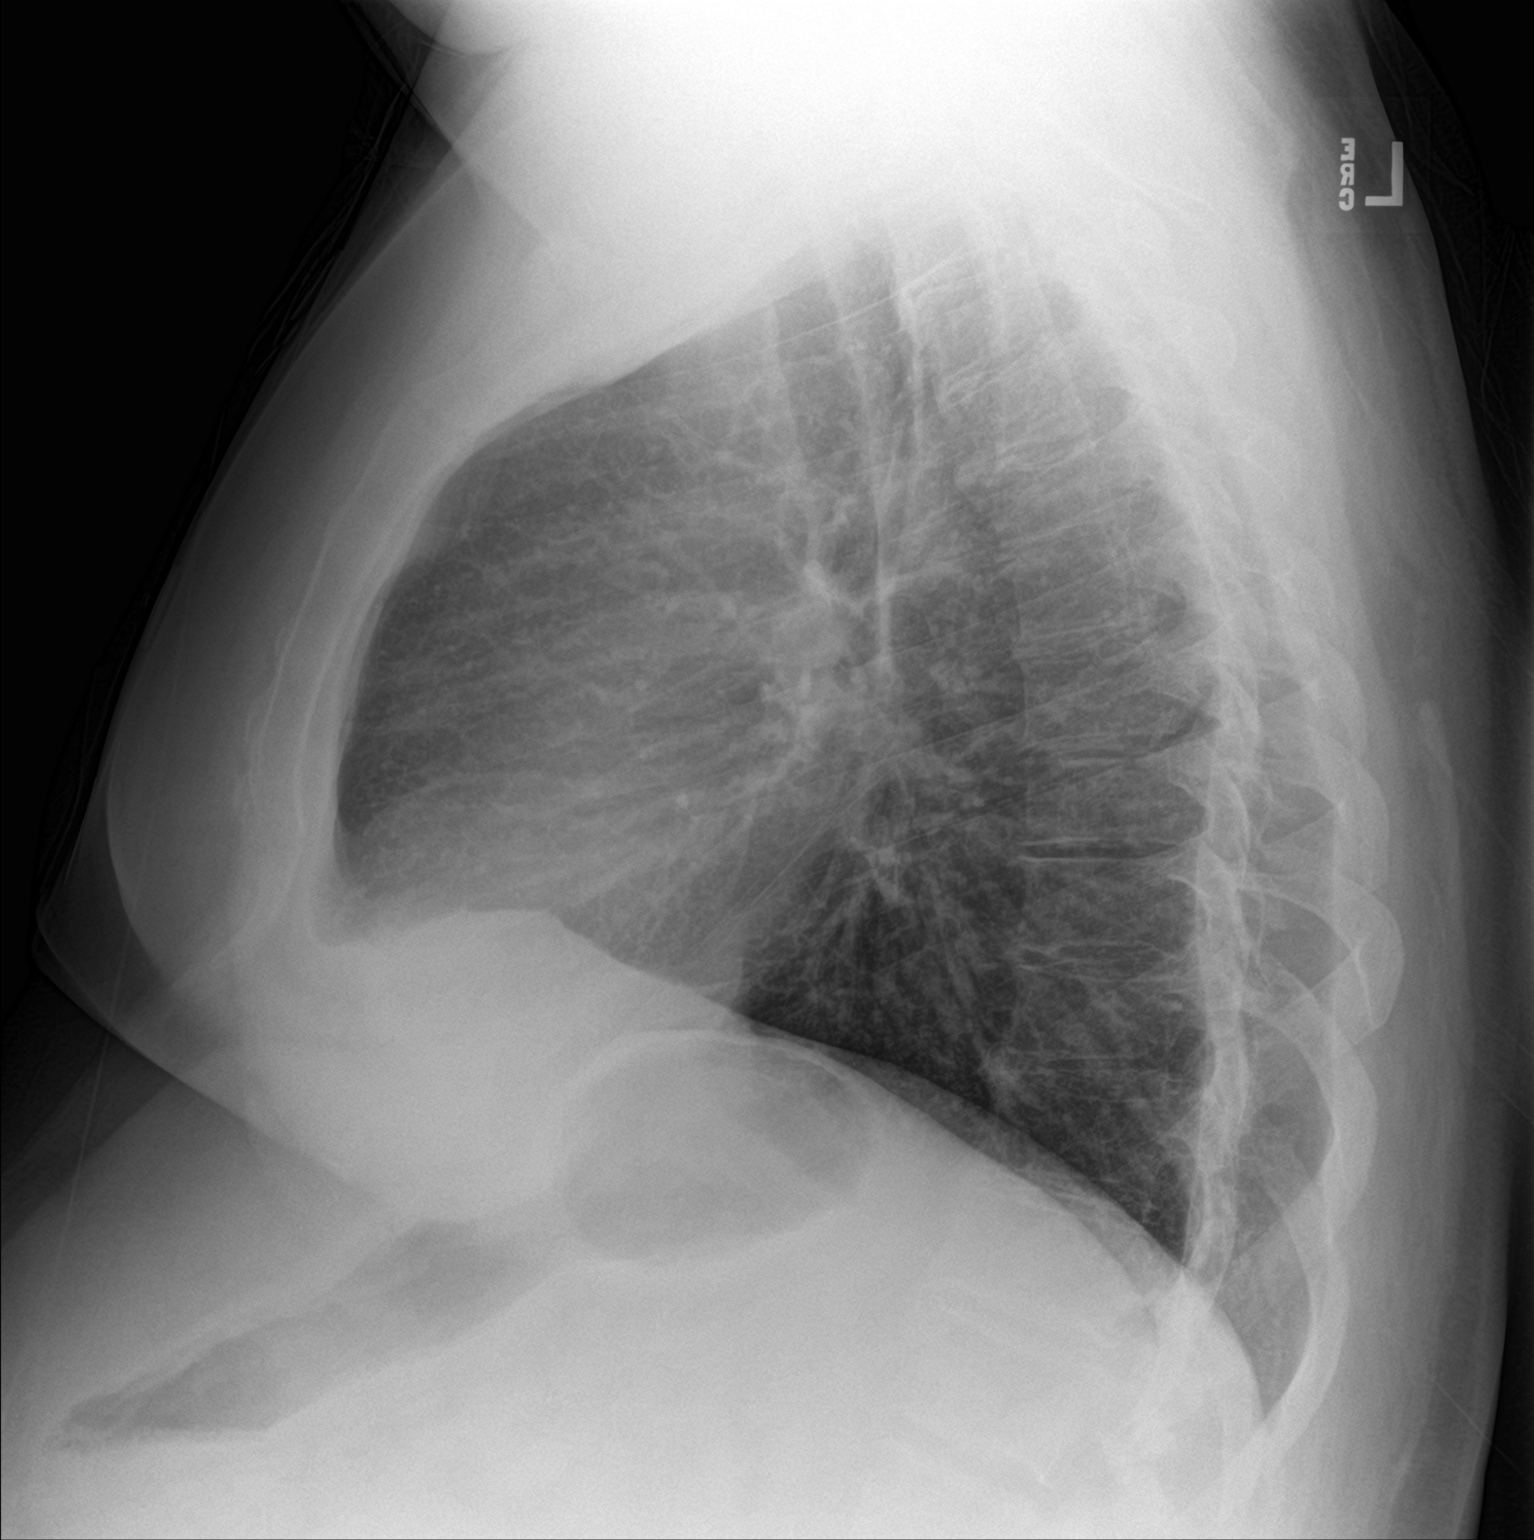

[2 of 2 positions shown; findings below may reference images not displayed]

FINDINGS: Mild peribronchial densities may represent reactive small airway
disease. There is no focal consolidation, pleural effusion, or
pneumothorax. The cardiac silhouette is within normal limits. No
acute osseous pathology.
IMPRESSION: No focal consolidation. Findings may represent reactive small airway
disease. Clinical correlation is recommended.

## 2018-05-12 DIAGNOSIS — J0120 Acute ethmoidal sinusitis, unspecified: Secondary | ICD-10-CM | POA: Diagnosis not present

## 2018-05-12 DIAGNOSIS — E118 Type 2 diabetes mellitus with unspecified complications: Secondary | ICD-10-CM | POA: Diagnosis not present

## 2018-05-12 DIAGNOSIS — E119 Type 2 diabetes mellitus without complications: Secondary | ICD-10-CM | POA: Diagnosis not present

## 2018-05-12 DIAGNOSIS — J0100 Acute maxillary sinusitis, unspecified: Secondary | ICD-10-CM | POA: Diagnosis not present

## 2019-03-27 DIAGNOSIS — E119 Type 2 diabetes mellitus without complications: Secondary | ICD-10-CM | POA: Diagnosis not present

## 2019-03-27 DIAGNOSIS — Z794 Long term (current) use of insulin: Secondary | ICD-10-CM | POA: Diagnosis not present

## 2019-03-27 DIAGNOSIS — E1165 Type 2 diabetes mellitus with hyperglycemia: Secondary | ICD-10-CM | POA: Diagnosis not present

## 2019-03-31 DIAGNOSIS — I10 Essential (primary) hypertension: Secondary | ICD-10-CM | POA: Diagnosis not present

## 2019-03-31 DIAGNOSIS — E034 Atrophy of thyroid (acquired): Secondary | ICD-10-CM | POA: Diagnosis not present

## 2019-03-31 DIAGNOSIS — E7849 Other hyperlipidemia: Secondary | ICD-10-CM | POA: Diagnosis not present

## 2019-03-31 DIAGNOSIS — E119 Type 2 diabetes mellitus without complications: Secondary | ICD-10-CM | POA: Diagnosis not present

## 2019-04-03 DIAGNOSIS — Z794 Long term (current) use of insulin: Secondary | ICD-10-CM | POA: Diagnosis not present

## 2019-04-03 DIAGNOSIS — E119 Type 2 diabetes mellitus without complications: Secondary | ICD-10-CM | POA: Diagnosis not present

## 2019-04-03 DIAGNOSIS — I10 Essential (primary) hypertension: Secondary | ICD-10-CM | POA: Diagnosis not present

## 2019-08-20 ENCOUNTER — Other Ambulatory Visit: Payer: Self-pay | Admitting: *Deleted

## 2019-08-20 MED ORDER — BASAGLAR KWIKPEN 100 UNIT/ML ~~LOC~~ SOPN: 40.0000 [IU] | PEN_INJECTOR | Freq: Every day | SUBCUTANEOUS | 6 refills | Status: AC

## 2019-09-18 ENCOUNTER — Other Ambulatory Visit: Payer: Self-pay | Admitting: Internal Medicine

## 2019-09-24 ENCOUNTER — Other Ambulatory Visit: Payer: Self-pay | Admitting: *Deleted

## 2019-09-24 MED ORDER — LOSARTAN POTASSIUM 100 MG PO TABS: 100.0000 mg | ORAL_TABLET | Freq: Every day | ORAL | 6 refills | Status: DC

## 2019-09-24 MED ORDER — MONTELUKAST SODIUM 10 MG PO TABS: 10.0000 mg | ORAL_TABLET | Freq: Every day | ORAL | 6 refills | Status: DC

## 2019-11-16 ENCOUNTER — Other Ambulatory Visit: Payer: Self-pay | Admitting: *Deleted

## 2019-11-16 MED ORDER — TRAZODONE HCL 50 MG PO TABS: 50.0000 mg | ORAL_TABLET | Freq: Every day | ORAL | 6 refills | Status: DC

## 2020-01-18 ENCOUNTER — Other Ambulatory Visit: Payer: Self-pay | Admitting: *Deleted

## 2020-01-18 DIAGNOSIS — E669 Obesity, unspecified: Secondary | ICD-10-CM

## 2020-03-14 ENCOUNTER — Other Ambulatory Visit: Payer: Self-pay | Admitting: *Deleted

## 2020-03-14 MED ORDER — GLIMEPIRIDE 4 MG PO TABS: 4.0000 mg | ORAL_TABLET | Freq: Every day | ORAL | Status: DC

## 2020-03-14 MED ORDER — IPRATROPIUM-ALBUTEROL 0.5-2.5 (3) MG/3ML IN SOLN: 3.0000 mL | RESPIRATORY_TRACT | 6 refills | Status: AC | PRN

## 2020-03-14 MED ORDER — GLIMEPIRIDE 4 MG PO TABS: 4.0000 mg | ORAL_TABLET | Freq: Every day | ORAL | 3 refills | Status: DC

## 2020-03-28 ENCOUNTER — Encounter: Payer: Self-pay | Admitting: Internal Medicine

## 2020-03-28 ENCOUNTER — Other Ambulatory Visit: Payer: Self-pay

## 2020-03-28 ENCOUNTER — Ambulatory Visit (INDEPENDENT_AMBULATORY_CARE_PROVIDER_SITE_OTHER): Payer: BC Managed Care – PPO | Admitting: Internal Medicine

## 2020-03-28 VITALS — BP 156/99 | HR 88 | Ht 71.0 in | Wt 279.4 lb

## 2020-03-28 DIAGNOSIS — J301 Allergic rhinitis due to pollen: Secondary | ICD-10-CM

## 2020-03-28 DIAGNOSIS — E669 Obesity, unspecified: Secondary | ICD-10-CM | POA: Diagnosis not present

## 2020-03-28 DIAGNOSIS — I10 Essential (primary) hypertension: Secondary | ICD-10-CM | POA: Diagnosis not present

## 2020-03-28 DIAGNOSIS — J069 Acute upper respiratory infection, unspecified: Secondary | ICD-10-CM | POA: Diagnosis not present

## 2020-03-28 LAB — POC COVID19 BINAXNOW: SARS Coronavirus 2 Ag: NEGATIVE

## 2020-03-28 MED ORDER — LOSARTAN POTASSIUM 100 MG PO TABS: 100.0000 mg | ORAL_TABLET | Freq: Every day | ORAL | 6 refills | Status: DC

## 2020-03-28 MED ORDER — MONTELUKAST SODIUM 10 MG PO TABS: 10.0000 mg | ORAL_TABLET | Freq: Every day | ORAL | 6 refills | Status: DC

## 2020-03-28 MED ORDER — AZITHROMYCIN 250 MG PO TABS: ORAL_TABLET | ORAL | 0 refills | Status: DC

## 2020-03-28 NOTE — Assessment & Plan Note (Signed)
Started on Zithromax.

## 2020-03-28 NOTE — Progress Notes (Signed)
Established Patient Office Visit  Subjective:  Patient ID: Brandon Whiteford., male    DOB: [birth year]  Age: 44 y.o. MRN: [id number]  CC:  Chief Complaint  Patient presents with  . URI    Sinus pressure, cough, congestion, x week and a half .Patient states sxs started after mulching leaves    HPI  [employer] [employer] presents for uri  Past Medical History:  Diagnosis Date  . COPD (chronic obstructive pulmonary disease) ([place])   . Hypertension   . Sleep apnea     Past Surgical History:  Procedure Laterality Date  . LESION REMOVAL     back    Family History  Problem Relation Age of Onset  . Cancer Mother        ovarian  . Cancer Maternal Grandmother        pancreatic, stomach, liver    Social History   Socioeconomic History  . Marital status: Single    Spouse name: Not on file  . Number of children: Not on file  . Years of education: Not on file  . Highest education level: Not on file  Occupational History  . Not on file  Tobacco Use  . Smoking status: Former Smoker    Packs/day: 1.00    Years: 5.00    Pack years: 5.00    Types: Cigarettes  . Smokeless tobacco: Current User    Types: Chew  Substance and Sexual Activity  . Alcohol use: No  . Drug use: No  . Sexual activity: Not on file  Other Topics Concern  . Not on file  Social History Narrative  . Not on file   Social Determinants of Health   Financial Resource Strain: Not on file  Food Insecurity: Not on file  Transportation Needs: Not on file  Physical Activity: Not on file  Stress: Not on file  Social Connections: Not on file  Intimate Partner Violence: Not on file     Current Outpatient Medications:  .  acetaminophen (TYLENOL) 325 MG tablet, Take 2 tablets (650 mg total) by mouth every 6 (six) hours as needed for mild pain (or Fever >/= 101)., Disp: 20 tablet, Rfl: 0 .  albuterol (PROVENTIL HFA;VENTOLIN HFA) 108 (90 Base) MCG/ACT inhaler, Inhale 2 puffs into the lungs every  4 (four) hours as needed for wheezing or shortness of breath., Disp: 1 Inhaler, Rfl: 0 .  aspirin 81 MG EC tablet, Take 81 mg by mouth daily as needed. , Disp: , Rfl:  .  benzonatate (TESSALON) 200 MG capsule, Take 1 capsule (200 mg total) by mouth 3 (three) times daily as needed for cough., Disp: 20 capsule, Rfl: 0 .  Biotin 5000 MCG CAPS, Take 1 capsule by mouth daily., Disp: , Rfl:  .  cyclobenzaprine (FLEXERIL) 10 MG tablet, Take 1 tablet (10 mg total) by mouth 3 (three) times daily as needed for muscle spasms., Disp: 30 tablet, Rfl: 0 .  doxycycline (VIBRA-TABS) 100 MG tablet, Take 1 tablet (100 mg total) by mouth every 12 (twelve) hours., Disp: 6 tablet, Rfl: 0 .  glimepiride (AMARYL) 4 MG tablet, Take 1 tablet (4 mg total) by mouth daily., Disp: 90 tablet, Rfl: 3. .  glimepiride (AMARYL) 4 MG tablet, Take 1 tablet (4 mg total) by mouth daily before breakfast., Disp: 90 tablet, Rfl: 3 .  Insulin Glargine (BASAGLAR KWIKPEN) 100 UNIT/ML, Inject 0.4 mLs (40 Units total) into the skin daily., Disp: 1 pen, Rfl: 6 .  ipratropium-albuterol (DUONEB)  0.5-2.5 (3) MG/3ML SOLN, Take 3 mLs by nebulization every 4 (four) hours as needed., Disp: 360 mL, Rfl: 6 .  Multiple Vitamins-Minerals (MULTIVITAMIN WITH MINERALS) tablet, Take 1 tablet by mouth daily., Disp: , Rfl:  .  naproxen (NAPROSYN) 500 MG tablet, Take 1 tablet (500 mg total) by mouth 2 (two) times daily with a meal. (Patient taking differently: Take 500 mg by mouth 2 (two) times daily as needed.), Disp: 30 tablet, Rfl: 0 .  omega-3 acid ethyl esters (LOVAZA) 1 g capsule, Take 1 g by mouth daily., Disp: , Rfl:  .  oseltamivir (TAMIFLU) 75 MG capsule, Take 1 capsule (75 mg total) by mouth 2 (two) times daily., Disp: 6 capsule, Rfl: 0 .  predniSONE (DELTASONE) 50 MG tablet, 1 po daily, Disp: 5 tablet, Rfl: 0 .  traZODone (DESYREL) 50 MG tablet, Take 1 tablet (50 mg total) by mouth at bedtime., Disp: 30 tablet, Rfl: 6 .  vitamin C (ASCORBIC ACID) 500  MG tablet, Take 1,000 mg by mouth daily., Disp: , Rfl:  .  azithromycin (ZITHROMAX) 250 MG tablet, 2 tab po daily fo3 days, Disp: 6 tablet, Rfl: 0 .  losartan (COZAAR) 100 MG tablet, Take 1 tablet (100 mg total) by mouth daily., Disp: 30 tablet, Rfl: 6 .  montelukast (SINGULAIR) 10 MG tablet, Take 1 tablet (10 mg total) by mouth daily., Disp: 30 tablet, Rfl: 6   No Known Allergies  ROS Review of Systems  Constitutional: Negative.   HENT: Positive for congestion and sinus pain.   Eyes: Negative.   Respiratory: Negative.   Cardiovascular: Negative.   Gastrointestinal: Negative.   Endocrine: Negative.   Genitourinary: Negative.   Musculoskeletal: Negative.   Skin: Negative.   Allergic/Immunologic: Negative.   Neurological: Negative.   Hematological: Negative.   Psychiatric/Behavioral: Negative.   All other systems reviewed and are negative.     Objective:    Physical Exam Vitals reviewed.  Constitutional:      Appearance: Normal appearance.  HENT:     Head: Normocephalic and atraumatic.     Salivary Glands: Right salivary gland is not tender. Left salivary gland is not tender.     Mouth/Throat:     Mouth: Mucous membranes are moist.  Eyes:     Pupils: Pupils are equal, round, and reactive to light.  Neck:     Vascular: No carotid bruit.  Cardiovascular:     Rate and Rhythm: Normal rate and regular rhythm.     Pulses: Normal pulses.     Heart sounds: Normal heart sounds.  Pulmonary:     Effort: Pulmonary effort is normal.     Breath sounds: Normal breath sounds.  Abdominal:     General: Bowel sounds are normal.     Palpations: Abdomen is soft. There is no hepatomegaly, splenomegaly or mass.     Tenderness: There is no abdominal tenderness.     Hernia: No hernia is present.  Musculoskeletal:     Cervical back: Neck supple.     Right lower leg: No edema.     Left lower leg: No edema.  Skin:    Findings: No rash.  Neurological:     Mental Status: He is alert and  oriented to person, place, and time.     Motor: No weakness.  Psychiatric:        Mood and Affect: Mood normal.        Behavior: Behavior normal.     BP (!) 156/99   Pulse 88  Ht 5\' 11"  (1.803 m)   Wt 279 lb 6.4 oz (126.7 kg)   BMI 38.97 kg/m  Wt Readings from Last 3 Encounters:  03/28/20 279 lb 6.4 oz (126.7 kg)  07/14/17 (!) 300 lb 9.6 oz (136.4 kg)  06/21/17 295 lb (133.8 kg)     Health Maintenance Due  Topic Date Due  . Hepatitis C Screening  Never done  . COVID-19 Vaccine (1) Never done  . TETANUS/TDAP  Never done  . INFLUENZA VACCINE  Never done    There are no preventive care reminders to display for this patient.  No results found for: TSH Lab Results  Component Value Date   WBC 12.5 (H) 07/15/2017   HGB 13.8 07/15/2017   HCT 39.1 (L) 07/15/2017   MCV 86.9 07/15/2017   PLT 198 07/15/2017   Lab Results  Component Value Date   NA 137 07/15/2017   K 3.6 07/15/2017   CO2 22 07/15/2017   GLUCOSE 247 (H) 07/15/2017   BUN 13 07/15/2017   CREATININE 0.77 07/15/2017   BILITOT 0.9 07/13/2017   ALKPHOS 79 07/13/2017   AST 74 (H) 07/13/2017   ALT 82 (H) 07/13/2017   PROT 7.8 07/13/2017   ALBUMIN 4.3 07/13/2017   CALCIUM 8.1 (L) 07/15/2017   ANIONGAP 10 07/15/2017   No results found for: CHOL No results found for: HDL No results found for: LDLCALC No results found for: TRIG No results found for: CHOLHDL No results found for: HGBA1C    Assessment & Plan:   Problem List Items Addressed This Visit      Cardiovascular and Mediastinum   Hypertension   Relevant Medications   losartan (COZAAR) 100 MG tablet     Respiratory   Seasonal allergic rhinitis due to pollen    Patient was started on Z-Pak as directed also started on Singulair 10 mg p.o. daily.  He does not smoke does not drink.  He was advised to lose weight.      URI, acute - Primary    Started on Zithromax.      Relevant Medications   azithromycin (ZITHROMAX) 250 MG tablet    montelukast (SINGULAIR) 10 MG tablet     Other   Obesity    - I encouraged the patient to lose weight.  - I educated them on making healthy dietary choices including eating more fruits and vegetables and less fried foods. - I encouraged the patient to exercise more, and educated on the benefits of exercise including weight loss, diabetes prevention, and hypertension.           Meds ordered this encounter  Medications  . azithromycin (ZITHROMAX) 250 MG tablet    Sig: 2 tab po daily fo3 days    Dispense:  6 tablet    Refill:  0  . losartan (COZAAR) 100 MG tablet    Sig: Take 1 tablet (100 mg total) by mouth daily.    Dispense:  30 tablet    Refill:  6  . montelukast (SINGULAIR) 10 MG tablet    Sig: Take 1 tablet (10 mg total) by mouth daily.    Dispense:  30 tablet    Refill:  6    Follow-up: No follow-ups on file.    Cletis Athens, MD

## 2020-03-28 NOTE — Assessment & Plan Note (Signed)
Patient was started on Z-Pak as directed also started on Singulair 10 mg p.o. daily.  He does not smoke does not drink.  He was advised to lose weight.

## 2020-03-28 NOTE — Assessment & Plan Note (Signed)
-   I encouraged the patient to lose weight.  - I educated them on making healthy dietary choices including eating more fruits and vegetables and less fried foods. - I encouraged the patient to exercise more, and educated on the benefits of exercise including weight loss, diabetes prevention, and hypertension.

## 2020-06-02 ENCOUNTER — Other Ambulatory Visit: Payer: Self-pay

## 2020-06-02 MED ORDER — AMILORIDE-HYDROCHLOROTHIAZIDE 5-50 MG PO TABS: 1.0000 | ORAL_TABLET | Freq: Every day | ORAL | 1 refills | Status: DC

## 2020-06-27 ENCOUNTER — Encounter: Payer: Self-pay | Admitting: Internal Medicine

## 2020-06-27 ENCOUNTER — Ambulatory Visit (INDEPENDENT_AMBULATORY_CARE_PROVIDER_SITE_OTHER): Payer: BC Managed Care – PPO | Admitting: Internal Medicine

## 2020-06-27 ENCOUNTER — Other Ambulatory Visit: Payer: Self-pay

## 2020-06-27 VITALS — BP 150/93 | HR 88 | Ht 70.0 in | Wt 283.6 lb

## 2020-06-27 DIAGNOSIS — E669 Obesity, unspecified: Secondary | ICD-10-CM | POA: Diagnosis not present

## 2020-06-27 DIAGNOSIS — E139 Other specified diabetes mellitus without complications: Secondary | ICD-10-CM

## 2020-06-27 DIAGNOSIS — J301 Allergic rhinitis due to pollen: Secondary | ICD-10-CM | POA: Diagnosis not present

## 2020-06-27 DIAGNOSIS — G4730 Sleep apnea, unspecified: Secondary | ICD-10-CM | POA: Insufficient documentation

## 2020-06-27 DIAGNOSIS — I10 Essential (primary) hypertension: Secondary | ICD-10-CM | POA: Diagnosis not present

## 2020-06-27 DIAGNOSIS — G4737 Central sleep apnea in conditions classified elsewhere: Secondary | ICD-10-CM

## 2020-06-27 MED ORDER — TRAZODONE HCL 50 MG PO TABS: 50.0000 mg | ORAL_TABLET | Freq: Every day | ORAL | 6 refills | Status: DC

## 2020-06-27 MED ORDER — METFORMIN HCL 500 MG PO TABS: 500.0000 mg | ORAL_TABLET | Freq: Two times a day (BID) | ORAL | 3 refills | Status: DC

## 2020-06-27 NOTE — Progress Notes (Signed)
Established Patient Office Visit  Subjective:  Patient ID: Brandon Lipsey., male    DOB: December 26, 1975  Age: 45 y.o. MRN: [id number]  CC:  Chief Complaint  Patient presents with  . Follow-up    HPI  Brandon Mcshan. presents for general check  Past Medical History:  Diagnosis Date  . COPD (chronic obstructive pulmonary disease) ([place])   . Hypertension   . Sleep apnea     Past Surgical History:  Procedure Laterality Date  . LESION REMOVAL     back    Family History  Problem Relation Age of Onset  . Cancer Mother        ovarian  . Cancer Maternal Grandmother        pancreatic, stomach, liver    Social History   Socioeconomic History  . Marital status: Single    Spouse name: Not on file  . Number of children: Not on file  . Years of education: Not on file  . Highest education level: Not on file  Occupational History  . Not on file  Tobacco Use  . Smoking status: Former Smoker    Packs/day: 1.00    Years: 5.00    Pack years: 5.00    Types: Cigarettes  . Smokeless tobacco: Current User    Types: Chew  Substance and Sexual Activity  . Alcohol use: No  . Drug use: No  . Sexual activity: Not on file  Other Topics Concern  . Not on file  Social History Narrative  . Not on file   Social Determinants of Health   Financial Resource Strain: Not on file  Food Insecurity: Not on file  Transportation Needs: Not on file  Physical Activity: Not on file  Stress: Not on file  Social Connections: Not on file  Intimate Partner Violence: Not on file     Current Outpatient Medications:  .  acetaminophen (TYLENOL) 325 MG tablet, Take 2 tablets (650 mg total) by mouth every 6 (six) hours as needed for mild pain (or Fever >/= 101)., Disp: 20 tablet, Rfl: 0 .  albuterol (PROVENTIL HFA;VENTOLIN HFA) 108 (90 Base) MCG/ACT inhaler, Inhale 2 puffs into the lungs every 4 (four) hours as needed for wheezing or shortness of breath., Disp: 1 Inhaler, Rfl: 0 .   amiloride-hydrochlorothiazide (MODURETIC) 5-50 MG tablet, Take 1 tablet by mouth daily., Disp: 90 tablet, Rfl: 1 .  aspirin 81 MG EC tablet, Take 81 mg by mouth daily as needed. , Disp: , Rfl:  .  azithromycin (ZITHROMAX) 250 MG tablet, 2 tab po daily fo3 days, Disp: 6 tablet, Rfl: 0 .  benzonatate (TESSALON) 200 MG capsule, Take 1 capsule (200 mg total) by mouth 3 (three) times daily as needed for cough., Disp: 20 capsule, Rfl: 0 .  Biotin 5000 MCG CAPS, Take 1 capsule by mouth daily., Disp: , Rfl:  .  cyclobenzaprine (FLEXERIL) 10 MG tablet, Take 1 tablet (10 mg total) by mouth 3 (three) times daily as needed for muscle spasms., Disp: 30 tablet, Rfl: 0 .  doxycycline (VIBRA-TABS) 100 MG tablet, Take 1 tablet (100 mg total) by mouth every 12 (twelve) hours., Disp: 6 tablet, Rfl: 0 .  glimepiride (AMARYL) 4 MG tablet, Take 1 tablet (4 mg total) by mouth daily., Disp: 90 tablet, Rfl: 3. .  glimepiride (AMARYL) 4 MG tablet, Take 1 tablet (4 mg total) by mouth daily before breakfast., Disp: 90 tablet, Rfl: 3 .  Insulin Glargine (BASAGLAR KWIKPEN) 100 UNIT/ML, Inject  0.4 mLs (40 Units total) into the skin daily., Disp: 1 pen, Rfl: 6 .  ipratropium-albuterol (DUONEB) 0.5-2.5 (3) MG/3ML SOLN, Take 3 mLs by nebulization every 4 (four) hours as needed., Disp: 360 mL, Rfl: 6 .  losartan (COZAAR) 100 MG tablet, Take 1 tablet (100 mg total) by mouth daily., Disp: 30 tablet, Rfl: 6 .  metFORMIN (GLUCOPHAGE) 500 MG tablet, Take 1 tablet (500 mg total) by mouth 2 (two) times daily with a meal., Disp: 180 tablet, Rfl: 3 .  montelukast (SINGULAIR) 10 MG tablet, Take 1 tablet (10 mg total) by mouth daily., Disp: 30 tablet, Rfl: 6 .  Multiple Vitamins-Minerals (MULTIVITAMIN WITH MINERALS) tablet, Take 1 tablet by mouth daily., Disp: , Rfl:  .  naproxen (NAPROSYN) 500 MG tablet, Take 1 tablet (500 mg total) by mouth 2 (two) times daily with a meal. (Patient taking differently: Take 500 mg by mouth 2 (two) times daily as  needed.), Disp: 30 tablet, Rfl: 0 .  omega-3 acid ethyl esters (LOVAZA) 1 g capsule, Take 1 g by mouth daily., Disp: , Rfl:  .  oseltamivir (TAMIFLU) 75 MG capsule, Take 1 capsule (75 mg total) by mouth 2 (two) times daily., Disp: 6 capsule, Rfl: 0 .  predniSONE (DELTASONE) 50 MG tablet, 1 po daily, Disp: 5 tablet, Rfl: 0 .  vitamin C (ASCORBIC ACID) 500 MG tablet, Take 1,000 mg by mouth daily., Disp: , Rfl:  .  traZODone (DESYREL) 50 MG tablet, Take 1 tablet (50 mg total) by mouth at bedtime., Disp: 30 tablet, Rfl: 6   No Known Allergies  ROS Review of Systems  Constitutional: Negative.   HENT: Negative.   Eyes: Negative.   Respiratory: Negative.   Cardiovascular: Negative.   Gastrointestinal: Negative.   Endocrine: Negative.   Genitourinary: Negative.   Musculoskeletal: Negative.   Skin: Negative.   Allergic/Immunologic: Negative.   Neurological: Negative.   Hematological: Negative.   Psychiatric/Behavioral: Negative.   All other systems reviewed and are negative.     Objective:    Physical Exam Vitals reviewed.  Constitutional:      Appearance: Normal appearance. He is obese.  HENT:     Mouth/Throat:     Mouth: Mucous membranes are moist.  Eyes:     Pupils: Pupils are equal, round, and reactive to light.  Neck:     Vascular: No carotid bruit.  Cardiovascular:     Rate and Rhythm: Normal rate and regular rhythm.     Pulses: Normal pulses.     Heart sounds: Normal heart sounds.  Pulmonary:     Effort: Pulmonary effort is normal.     Breath sounds: Normal breath sounds.  Abdominal:     General: Bowel sounds are normal.     Palpations: Abdomen is soft. There is no hepatomegaly, splenomegaly or mass.     Tenderness: There is no abdominal tenderness.     Hernia: No hernia is present.  Musculoskeletal:     Cervical back: Neck supple.     Right lower leg: No edema.     Left lower leg: No edema.  Skin:    Findings: No rash.  Neurological:     Mental Status: He  is alert and oriented to person, place, and time.     Motor: No weakness.  Psychiatric:        Mood and Affect: Mood normal.        Behavior: Behavior normal.     BP (!) 150/93   Pulse 88  Ht 5\' 10"  (1.778 m)   Wt 283 lb 9.6 oz (128.6 kg)   BMI 40.69 kg/m  Wt Readings from Last 3 Encounters:  06/27/20 283 lb 9.6 oz (128.6 kg)  03/28/20 279 lb 6.4 oz (126.7 kg)  07/14/17 (!) 300 lb 9.6 oz (136.4 kg)     Health Maintenance Due  Topic Date Due  . HEMOGLOBIN A1C  Never done  . Hepatitis C Screening  Never done  . PNEUMOCOCCAL POLYSACCHARIDE VACCINE AGE 37-64 HIGH RISK  Never done  . COVID-19 Vaccine (1) Never done  . FOOT EXAM  Never done  . OPHTHALMOLOGY EXAM  Never done  . TETANUS/TDAP  Never done  . INFLUENZA VACCINE  Never done  . COLONOSCOPY (Pts 45-27yrs Insurance coverage will need to be confirmed)  Never done    There are no preventive care reminders to display for this patient.  No results found for: TSH Lab Results  Component Value Date   WBC 12.5 (H) 07/15/2017   HGB 13.8 07/15/2017   HCT 39.1 (L) 07/15/2017   MCV 86.9 07/15/2017   PLT 198 07/15/2017   Lab Results  Component Value Date   NA 137 07/15/2017   K 3.6 07/15/2017   CO2 22 07/15/2017   GLUCOSE 247 (H) 07/15/2017   BUN 13 07/15/2017   CREATININE 0.77 07/15/2017   BILITOT 0.9 07/13/2017   ALKPHOS 79 07/13/2017   AST 74 (H) 07/13/2017   ALT 82 (H) 07/13/2017   PROT 7.8 07/13/2017   ALBUMIN 4.3 07/13/2017   CALCIUM 8.1 (L) 07/15/2017   ANIONGAP 10 07/15/2017   No results found for: CHOL No results found for: HDL No results found for: LDLCALC No results found for: TRIG No results found for: CHOLHDL No results found for: HGBA1C    Assessment & Plan:   Problem List Items Addressed This Visit      Cardiovascular and Mediastinum   Hypertension - Primary     Respiratory   Seasonal allergic rhinitis due to pollen    Patient was advised to continue using Claritin and Singulair       Sleep apnea    Patient was advised to continue using his CPAP patient        Endocrine   Diabetes 1.5, managed as type 2 ([place])    - The patient's blood sugar is not under control on med. - The patient will continue the current treatment regimen. Add  metformin - I encouraged the patient to regularly check blood sugar.  - I encouraged the patient to monitor diet. I encouraged the patient to eat low-carb and low-sugar to help prevent blood sugar spikes.  - I encouraged the patient to continue following their prescribed treatment plan for diabetes - I informed the patient to get help if blood sugar drops below 54mg /dL, or if suddenly have trouble thinking clearly or breathing.       Relevant Medications   metFORMIN (GLUCOPHAGE) 500 MG tablet     Other   Obesity    - I encouraged the patient to lose weight.  - I educated them on making healthy dietary choices including eating more fruits and vegetables and less fried foods. - I encouraged the patient to exercise more, and educated on the benefits of exercise including weight loss, diabetes prevention, and hypertension prevention.  Patient was advised to consider stomach bypass surgery       Relevant Medications   metFORMIN (GLUCOPHAGE) 500 MG tablet      Meds ordered  this encounter  Medications  . traZODone (DESYREL) 50 MG tablet    Sig: Take 1 tablet (50 mg total) by mouth at bedtime.    Dispense:  30 tablet    Refill:  6  . metFORMIN (GLUCOPHAGE) 500 MG tablet    Sig: Take 1 tablet (500 mg total) by mouth 2 (two) times daily with a meal.    Dispense:  180 tablet    Refill:  3    Follow-up: No follow-ups on file.    Cletis Athens, MD

## 2020-06-27 NOTE — Assessment & Plan Note (Signed)
Patient was advised to continue using his CPAP patient

## 2020-06-27 NOTE — Assessment & Plan Note (Signed)
-   The patient's blood sugar is not under control on med. - The patient will continue the current treatment regimen. Add  metformin - I encouraged the patient to regularly check blood sugar.  - I encouraged the patient to monitor diet. I encouraged the patient to eat low-carb and low-sugar to help prevent blood sugar spikes.  - I encouraged the patient to continue following their prescribed treatment plan for diabetes - I informed the patient to get help if blood sugar drops below 54mg /dL, or if suddenly have trouble thinking clearly or breathing.

## 2020-06-27 NOTE — Assessment & Plan Note (Signed)
-   I encouraged the patient to lose weight.  - I educated them on making healthy dietary choices including eating more fruits and vegetables and less fried foods. - I encouraged the patient to exercise more, and educated on the benefits of exercise including weight loss, diabetes prevention, and hypertension prevention.  Patient was advised to consider stomach bypass surgery

## 2020-06-27 NOTE — Assessment & Plan Note (Signed)
Patient was advised to continue using Claritin and Singulair

## 2020-07-25 ENCOUNTER — Other Ambulatory Visit: Payer: Self-pay | Admitting: *Deleted

## 2020-07-25 DIAGNOSIS — I10 Essential (primary) hypertension: Secondary | ICD-10-CM

## 2020-07-25 MED ORDER — LOSARTAN POTASSIUM 100 MG PO TABS: 100.0000 mg | ORAL_TABLET | Freq: Every day | ORAL | 3 refills | Status: DC

## 2020-07-25 MED ORDER — CONTOUR NEXT TEST VI STRP: ORAL_STRIP | 12 refills | Status: AC

## 2020-08-08 ENCOUNTER — Other Ambulatory Visit: Payer: Self-pay | Admitting: *Deleted

## 2020-08-08 MED ORDER — ATORVASTATIN CALCIUM 10 MG PO TABS: 1.0000 | ORAL_TABLET | Freq: Every day | ORAL | 3 refills | Status: AC

## 2020-08-11 ENCOUNTER — Encounter: Payer: Self-pay | Admitting: Internal Medicine

## 2020-08-11 ENCOUNTER — Ambulatory Visit: Payer: BC Managed Care – PPO | Admitting: Internal Medicine

## 2020-08-11 ENCOUNTER — Other Ambulatory Visit: Payer: Self-pay

## 2020-08-11 VITALS — BP 158/97 | HR 96 | Ht 69.0 in | Wt 290.6 lb

## 2020-08-11 DIAGNOSIS — J069 Acute upper respiratory infection, unspecified: Secondary | ICD-10-CM

## 2020-08-11 DIAGNOSIS — E669 Obesity, unspecified: Secondary | ICD-10-CM

## 2020-08-11 DIAGNOSIS — G4737 Central sleep apnea in conditions classified elsewhere: Secondary | ICD-10-CM

## 2020-08-11 DIAGNOSIS — J301 Allergic rhinitis due to pollen: Secondary | ICD-10-CM

## 2020-08-11 DIAGNOSIS — J329 Chronic sinusitis, unspecified: Secondary | ICD-10-CM | POA: Diagnosis not present

## 2020-08-11 DIAGNOSIS — I10 Essential (primary) hypertension: Secondary | ICD-10-CM

## 2020-08-11 DIAGNOSIS — E139 Other specified diabetes mellitus without complications: Secondary | ICD-10-CM

## 2020-08-11 LAB — POC COVID19 BINAXNOW: SARS Coronavirus 2 Ag: NEGATIVE

## 2020-08-11 MED ORDER — AZITHROMYCIN 250 MG PO TABS: ORAL_TABLET | ORAL | 0 refills | Status: DC

## 2020-08-11 NOTE — Progress Notes (Signed)
Established Patient Office Visit  Subjective:  Patient ID: Brandon Bender., male    DOB: 1975-07-05  Age: 45 y.o. MRN: [id number]  CC:  Chief Complaint  Patient presents with  . Hypertension  . Sinusitis    HPI  [name] presents for sinusitis.  Patient is having some wheezing no fever no chills he has been immunized against COVID.  Past Medical History:  Diagnosis Date  . COPD (chronic obstructive pulmonary disease) ([place])   . Hypertension   . Sleep apnea     Past Surgical History:  Procedure Laterality Date  . LESION REMOVAL     back    Family History  Problem Relation Age of Onset  . Cancer Mother        ovarian  . Cancer Maternal Grandmother        pancreatic, stomach, liver    Social History   Socioeconomic History  . Marital status: Single    Spouse name: Not on file  . Number of children: Not on file  . Years of education: Not on file  . Highest education level: Not on file  Occupational History  . Not on file  Tobacco Use  . Smoking status: Former Smoker    Packs/day: 1.00    Years: 5.00    Pack years: 5.00    Types: Cigarettes  . Smokeless tobacco: Current User    Types: Chew  Substance and Sexual Activity  . Alcohol use: No  . Drug use: No  . Sexual activity: Not on file  Other Topics Concern  . Not on file  Social History Narrative  . Not on file   Social Determinants of Health   Financial Resource Strain: Not on file  Food Insecurity: Not on file  Transportation Needs: Not on file  Physical Activity: Not on file  Stress: Not on file  Social Connections: Not on file  Intimate Partner Violence: Not on file     Current Outpatient Medications:  .  acetaminophen (TYLENOL) 325 MG tablet, Take 2 tablets (650 mg total) by mouth every 6 (six) hours as needed for mild pain (or Fever >/= 101)., Disp: 20 tablet, Rfl: 0 .  albuterol (PROVENTIL HFA;VENTOLIN HFA) 108 (90 Base) MCG/ACT inhaler, Inhale 2 puffs into the  lungs every 4 (four) hours as needed for wheezing or shortness of breath., Disp: 1 Inhaler, Rfl: 0 .  amiloride-hydrochlorothiazide (MODURETIC) 5-50 MG tablet, Take 1 tablet by mouth daily., Disp: 90 tablet, Rfl: 1 .  aspirin 81 MG EC tablet, Take 81 mg by mouth daily as needed. , Disp: , Rfl:  .  atorvastatin (LIPITOR) 10 MG tablet, Take 1 tablet (10 mg total) by mouth daily., Disp: 90 tablet, Rfl: 3 .  benzonatate (TESSALON) 200 MG capsule, Take 1 capsule (200 mg total) by mouth 3 (three) times daily as needed for cough., Disp: 20 capsule, Rfl: 0 .  Biotin 5000 MCG CAPS, Take 1 capsule by mouth daily., Disp: , Rfl:  .  cyclobenzaprine (FLEXERIL) 10 MG tablet, Take 1 tablet (10 mg total) by mouth 3 (three) times daily as needed for muscle spasms., Disp: 30 tablet, Rfl: 0 .  doxycycline (VIBRA-TABS) 100 MG tablet, Take 1 tablet (100 mg total) by mouth every 12 (twelve) hours., Disp: 6 tablet, Rfl: 0 .  glimepiride (AMARYL) 4 MG tablet, Take 1 tablet (4 mg total) by mouth daily., Disp: 90 tablet, Rfl: 3. .  glimepiride (AMARYL) 4 MG tablet, Take 1 tablet (4  mg total) by mouth daily before breakfast., Disp: 90 tablet, Rfl: 3 .  glucose blood (CONTOUR NEXT TEST) test strip, Check blood sugary BID, Disp: 100 each, Rfl: 12 .  Insulin Glargine (BASAGLAR KWIKPEN) 100 UNIT/ML, Inject 0.4 mLs (40 Units total) into the skin daily., Disp: 1 pen, Rfl: 6 .  ipratropium-albuterol (DUONEB) 0.5-2.5 (3) MG/3ML SOLN, Take 3 mLs by nebulization every 4 (four) hours as needed., Disp: 360 mL, Rfl: 6 .  losartan (COZAAR) 100 MG tablet, Take 1 tablet (100 mg total) by mouth daily., Disp: 90 tablet, Rfl: 3 .  metFORMIN (GLUCOPHAGE) 500 MG tablet, Take 1 tablet (500 mg total) by mouth 2 (two) times daily with a meal., Disp: 180 tablet, Rfl: 3 .  montelukast (SINGULAIR) 10 MG tablet, Take 1 tablet (10 mg total) by mouth daily., Disp: 30 tablet, Rfl: 6 .  Multiple Vitamins-Minerals (MULTIVITAMIN WITH MINERALS) tablet, Take 1  tablet by mouth daily., Disp: , Rfl:  .  naproxen (NAPROSYN) 500 MG tablet, Take 1 tablet (500 mg total) by mouth 2 (two) times daily with a meal. (Patient taking differently: Take 500 mg by mouth 2 (two) times daily as needed.), Disp: 30 tablet, Rfl: 0 .  omega-3 acid ethyl esters (LOVAZA) 1 g capsule, Take 1 g by mouth daily., Disp: , Rfl:  .  oseltamivir (TAMIFLU) 75 MG capsule, Take 1 capsule (75 mg total) by mouth 2 (two) times daily., Disp: 6 capsule, Rfl: 0 .  predniSONE (DELTASONE) 50 MG tablet, 1 po daily, Disp: 5 tablet, Rfl: 0 .  traZODone (DESYREL) 50 MG tablet, Take 1 tablet (50 mg total) by mouth at bedtime., Disp: 30 tablet, Rfl: 6 .  vitamin C (ASCORBIC ACID) 500 MG tablet, Take 1,000 mg by mouth daily., Disp: , Rfl:  .  azithromycin (ZITHROMAX) 250 MG tablet, 2 tab po daily fo3 days, Disp: 6 tablet, Rfl: 0   No Known Allergies  ROS Review of Systems review of system is negative except for sinusitis Constitutional: Negative.   HENT: Negative.   Eyes: Negative.   Respiratory: Negative.   Cardiovascular: Negative.   Gastrointestinal: Negative.   Endocrine: Negative.   Genitourinary: Negative.   Musculoskeletal: Negative.   Skin: Negative.   Allergic/Immunologic: Negative.   Neurological: Negative.   Hematological: Negative.   Psychiatric/Behavioral: Negative.   All other systems reviewed and are negative.    Objective:    Physical Exam  exam is negative, patient is tender in the sinus area chest is clear heart is regular abdomen obese soft nontender without any hepatosplenomegaly there is no pedal edema.  BP (!) 158/97   Pulse 96   Ht 5\' 9"  (1.753 m)   Wt 290 lb 9.6 oz (131.8 kg)   BMI 42.91 kg/m  Wt Readings from Last 3 Encounters:  08/11/20 290 lb 9.6 oz (131.8 kg)  06/27/20 283 lb 9.6 oz (128.6 kg)  03/28/20 279 lb 6.4 oz (126.7 kg)     Health Maintenance Due  Topic Date Due  . HEMOGLOBIN A1C  Never done  . Hepatitis C Screening  Never done  .  PNEUMOCOCCAL POLYSACCHARIDE VACCINE AGE 64-64 HIGH RISK  Never done  . COVID-19 Vaccine (1) Never done  . FOOT EXAM  Never done  . OPHTHALMOLOGY EXAM  Never done  . TETANUS/TDAP  Never done  . COLONOSCOPY (Pts 45-44yrs Insurance coverage will need to be confirmed)  Never done    There are no preventive care reminders to display for this patient.  No  results found for: TSH Lab Results  Component Value Date   WBC 12.5 (H) 07/15/2017   HGB 13.8 07/15/2017   HCT 39.1 (L) 07/15/2017   MCV 86.9 07/15/2017   PLT 198 07/15/2017   Lab Results  Component Value Date   NA 137 07/15/2017   K 3.6 07/15/2017   CO2 22 07/15/2017   GLUCOSE 247 (H) 07/15/2017   BUN 13 07/15/2017   CREATININE 0.77 07/15/2017   BILITOT 0.9 07/13/2017   ALKPHOS 79 07/13/2017   AST 74 (H) 07/13/2017   ALT 82 (H) 07/13/2017   PROT 7.8 07/13/2017   ALBUMIN 4.3 07/13/2017   CALCIUM 8.1 (L) 07/15/2017   ANIONGAP 10 07/15/2017   No results found for: CHOL No results found for: HDL No results found for: LDLCALC No results found for: TRIG No results found for: CHOLHDL No results found for: HGBA1C    Assessment & Plan:   Problem List Items Addressed This Visit      Cardiovascular and Mediastinum   Hypertension    Patient blood pressure is above normal patient denies any chest pain or shortness of breath there is no history of palpitation or paroxysmal nocturnal dyspnea   patient was advised to follow low-salt low-cholesterol diet    ideally I want to keep systolic blood pressure below 130 mmHg, patient was asked to check blood pressure one times a week and give me a report on that.  Patient will be follow-up in 3 months  or earlier as needed, patient will call me back for any change in the cardiovascular symptoms           Respiratory   Seasonal allergic rhinitis due to pollen    Take Singulair as directed.      Sleep apnea    Patient was advised to lose weight, he is using the CPAP machine.       Sinusitis - Primary    We will start patient on azithromycin as directed.  Also he was advised to take Claritin 10 mg p.o. daily.  Use masks and avoid pollens.      Relevant Medications   azithromycin (ZITHROMAX) 250 MG tablet   Other Relevant Orders   POC COVID-19 (Completed)     Endocrine   Diabetes 1.5, managed as type 2 ([place])    Patient was advised to lose weight and follow low-cholesterol diet he says he is taking his medicines regularly.        Other   Obesity    - I encouraged the patient to lose weight.  - I educated them on making healthy dietary choices including eating more fruits and vegetables and less fried foods. - I encouraged the patient to exercise more, and educated on the benefits of exercise including weight loss, diabetes prevention, and hypertension prevention.   Dietary counseling with a registered dietician  Referral to a weight management support group (e.g. Weight Watchers, Overeaters Anonymous)  If your BMI is greater than 29 or you have gained more than 15 pounds you should work on weight loss.  Attend a healthy cooking class        Other Visit Diagnoses    Upper respiratory tract infection, unspecified type       Relevant Medications   azithromycin (ZITHROMAX) 250 MG tablet      Meds ordered this encounter  Medications  . azithromycin (ZITHROMAX) 250 MG tablet    Sig: 2 tab po daily fo3 days    Dispense:  6 tablet  Refill:  0   Patient liver function tests are abnormal it is due to fatty liver I told him to lose weight.  He has been immunized against COVID. Follow-up: No follow-ups on file.    Cletis Athens, MD

## 2020-08-11 NOTE — Assessment & Plan Note (Signed)

## 2020-08-11 NOTE — Assessment & Plan Note (Signed)
Patient was advised to lose weight, he is using the CPAP machine.

## 2020-08-11 NOTE — Assessment & Plan Note (Signed)
Patient was advised to lose weight and follow low-cholesterol diet he says he is taking his medicines regularly.

## 2020-08-11 NOTE — Assessment & Plan Note (Signed)
We will start patient on azithromycin as directed.  Also he was advised to take Claritin 10 mg p.o. daily.  Use masks and avoid pollens.

## 2020-08-11 NOTE — Assessment & Plan Note (Signed)
Take Singulair as directed.

## 2020-08-11 NOTE — Assessment & Plan Note (Signed)
Patient blood pressure is above normal patient denies any chest pain or shortness of breath there is no history of palpitation or paroxysmal nocturnal dyspnea   patient was advised to follow low-salt low-cholesterol diet    ideally I want to keep systolic blood pressure below 130 mmHg, patient was asked to check blood pressure one times a week and give me a report on that.  Patient will be follow-up in 3 months  or earlier as needed, patient will call me back for any change in the cardiovascular symptoms

## 2020-08-23 ENCOUNTER — Encounter: Payer: Self-pay | Admitting: Internal Medicine

## 2020-08-23 ENCOUNTER — Ambulatory Visit: Payer: BC Managed Care – PPO | Admitting: Internal Medicine

## 2020-08-23 ENCOUNTER — Other Ambulatory Visit: Payer: Self-pay

## 2020-08-23 VITALS — BP 136/88 | HR 68 | Ht 69.0 in | Wt 278.1 lb

## 2020-08-23 DIAGNOSIS — E669 Obesity, unspecified: Secondary | ICD-10-CM | POA: Diagnosis not present

## 2020-08-23 DIAGNOSIS — E139 Other specified diabetes mellitus without complications: Secondary | ICD-10-CM

## 2020-08-23 DIAGNOSIS — J301 Allergic rhinitis due to pollen: Secondary | ICD-10-CM | POA: Diagnosis not present

## 2020-08-23 DIAGNOSIS — I10 Essential (primary) hypertension: Secondary | ICD-10-CM

## 2020-08-23 DIAGNOSIS — G4737 Central sleep apnea in conditions classified elsewhere: Secondary | ICD-10-CM

## 2020-08-23 NOTE — Assessment & Plan Note (Signed)

## 2020-08-23 NOTE — Assessment & Plan Note (Signed)
Patient was advised to take Zyrtec 10 mg p.o. daily

## 2020-08-23 NOTE — Progress Notes (Signed)
Established Patient Office Visit  Subjective:  Patient ID: Brandon Bender., male    DOB: 09/18/1975  Age: 45 y.o. MRN: [id number]  CC:  Chief Complaint  Patient presents with  . Sinusitis    Follow up from sinusitis, Patient has finished his antibiotic but is still having cough and congestion.   Neg for covid     HPI  Brandon M Borah Jr. presents for checkup of the reason shortness of breath rhinorrhea Past Medical History:  Diagnosis Date  . COPD (chronic obstructive pulmonary disease) ([place])   . Hypertension   . Sleep apnea     Past Surgical History:  Procedure Laterality Date  . LESION REMOVAL     back    Family History  Problem Relation Age of Onset  . Cancer Mother        ovarian  . Cancer Maternal Grandmother        pancreatic, stomach, liver    Social History   Socioeconomic History  . Marital status: Single    Spouse name: Not on file  . Number of children: Not on file  . Years of education: Not on file  . Highest education level: Not on file  Occupational History  . Not on file  Tobacco Use  . Smoking status: Former Smoker    Packs/day: 1.00    Years: 5.00    Pack years: 5.00    Types: Cigarettes  . Smokeless tobacco: Current User    Types: Chew  Substance and Sexual Activity  . Alcohol use: No  . Drug use: No  . Sexual activity: Not on file  Other Topics Concern  . Not on file  Social History Narrative  . Not on file   Social Determinants of Health   Financial Resource Strain: Not on file  Food Insecurity: Not on file  Transportation Needs: Not on file  Physical Activity: Not on file  Stress: Not on file  Social Connections: Not on file  Intimate Partner Violence: Not on file     Current Outpatient Medications:  .  acetaminophen (TYLENOL) 325 MG tablet, Take 2 tablets (650 mg total) by mouth every 6 (six) hours as needed for mild pain (or Fever >/= 101)., Disp: 20 tablet, Rfl: 0 .  albuterol (PROVENTIL  HFA;VENTOLIN HFA) 108 (90 Base) MCG/ACT inhaler, Inhale 2 puffs into the lungs every 4 (four) hours as needed for wheezing or shortness of breath., Disp: 1 Inhaler, Rfl: 0 .  amiloride-hydrochlorothiazide (MODURETIC) 5-50 MG tablet, Take 1 tablet by mouth daily., Disp: 90 tablet, Rfl: 1 .  aspirin 81 MG EC tablet, Take 81 mg by mouth daily as needed. , Disp: , Rfl:  .  atorvastatin (LIPITOR) 10 MG tablet, Take 1 tablet (10 mg total) by mouth daily., Disp: 90 tablet, Rfl: 3 .  azithromycin (ZITHROMAX) 250 MG tablet, 2 tab po daily fo3 days, Disp: 6 tablet, Rfl: 0 .  benzonatate (TESSALON) 200 MG capsule, Take 1 capsule (200 mg total) by mouth 3 (three) times daily as needed for cough., Disp: 20 capsule, Rfl: 0 .  Biotin 5000 MCG CAPS, Take 1 capsule by mouth daily., Disp: , Rfl:  .  cyclobenzaprine (FLEXERIL) 10 MG tablet, Take 1 tablet (10 mg total) by mouth 3 (three) times daily as needed for muscle spasms., Disp: 30 tablet, Rfl: 0 .  doxycycline (VIBRA-TABS) 100 MG tablet, Take 1 tablet (100 mg total) by mouth every 12 (twelve) hours., Disp: 6 tablet, Rfl: 0 .  glimepiride (AMARYL) 4 MG tablet, Take 1 tablet (4 mg total) by mouth daily., Disp: 90 tablet, Rfl: 3. .  glimepiride (AMARYL) 4 MG tablet, Take 1 tablet (4 mg total) by mouth daily before breakfast., Disp: 90 tablet, Rfl: 3 .  glucose blood (CONTOUR NEXT TEST) test strip, Check blood sugary BID, Disp: 100 each, Rfl: 12 .  Insulin Glargine (BASAGLAR KWIKPEN) 100 UNIT/ML, Inject 0.4 mLs (40 Units total) into the skin daily., Disp: 1 pen, Rfl: 6 .  ipratropium-albuterol (DUONEB) 0.5-2.5 (3) MG/3ML SOLN, Take 3 mLs by nebulization every 4 (four) hours as needed., Disp: 360 mL, Rfl: 6 .  losartan (COZAAR) 100 MG tablet, Take 1 tablet (100 mg total) by mouth daily., Disp: 90 tablet, Rfl: 3 .  metFORMIN (GLUCOPHAGE) 500 MG tablet, Take 1 tablet (500 mg total) by mouth 2 (two) times daily with a meal., Disp: 180 tablet, Rfl: 3 .  montelukast  (SINGULAIR) 10 MG tablet, Take 1 tablet (10 mg total) by mouth daily., Disp: 30 tablet, Rfl: 6 .  Multiple Vitamins-Minerals (MULTIVITAMIN WITH MINERALS) tablet, Take 1 tablet by mouth daily., Disp: , Rfl:  .  naproxen (NAPROSYN) 500 MG tablet, Take 1 tablet (500 mg total) by mouth 2 (two) times daily with a meal. (Patient taking differently: Take 500 mg by mouth 2 (two) times daily as needed.), Disp: 30 tablet, Rfl: 0 .  omega-3 acid ethyl esters (LOVAZA) 1 g capsule, Take 1 g by mouth daily., Disp: , Rfl:  .  oseltamivir (TAMIFLU) 75 MG capsule, Take 1 capsule (75 mg total) by mouth 2 (two) times daily., Disp: 6 capsule, Rfl: 0 .  predniSONE (DELTASONE) 50 MG tablet, 1 po daily, Disp: 5 tablet, Rfl: 0 .  traZODone (DESYREL) 50 MG tablet, Take 1 tablet (50 mg total) by mouth at bedtime., Disp: 30 tablet, Rfl: 6 .  vitamin C (ASCORBIC ACID) 500 MG tablet, Take 1,000 mg by mouth daily., Disp: , Rfl:    No Known Allergies  ROS Review of Systems  Constitutional: Negative.   HENT: Negative.   Eyes: Negative.   Respiratory: Negative.   Cardiovascular: Negative.   Gastrointestinal: Negative.   Endocrine: Negative.   Genitourinary: Negative.   Musculoskeletal: Negative.   Skin: Negative.   Allergic/Immunologic: Negative.   Neurological: Negative.   Hematological: Negative.   Psychiatric/Behavioral: Negative.   All other systems reviewed and are negative.     Objective:    Physical Exam Vitals reviewed.  Constitutional:      Appearance: Normal appearance.  HENT:     Mouth/Throat:     Mouth: Mucous membranes are moist.  Eyes:     Pupils: Pupils are equal, round, and reactive to light.  Neck:     Vascular: No carotid bruit.  Cardiovascular:     Rate and Rhythm: Normal rate and regular rhythm.     Pulses: Normal pulses.     Heart sounds: Normal heart sounds.  Pulmonary:     Effort: Pulmonary effort is normal.     Breath sounds: Normal breath sounds.  Abdominal:     General:  Bowel sounds are normal.     Palpations: Abdomen is soft. There is no hepatomegaly, splenomegaly or mass.     Tenderness: There is no abdominal tenderness.     Hernia: No hernia is present.  Musculoskeletal:     Cervical back: Neck supple.     Right lower leg: No edema.     Left lower leg: No edema.  Skin:  Findings: No rash.  Neurological:     Mental Status: He is alert and oriented to person, place, and time.     Motor: No weakness.  Psychiatric:        Mood and Affect: Mood normal.        Behavior: Behavior normal.     BP 136/88   Pulse 68   Ht 5\' 9"  (1.753 m)   Wt 278 lb 1.6 oz (126.1 kg)   BMI 41.07 kg/m  Wt Readings from Last 3 Encounters:  08/23/20 278 lb 1.6 oz (126.1 kg)  08/11/20 290 lb 9.6 oz (131.8 kg)  06/27/20 283 lb 9.6 oz (128.6 kg)     Health Maintenance Due  Topic Date Due  . HEMOGLOBIN A1C  Never done  . PNEUMOCOCCAL POLYSACCHARIDE VACCINE AGE 38-64 HIGH RISK  Never done  . COVID-19 Vaccine (1) Never done  . FOOT EXAM  Never done  . OPHTHALMOLOGY EXAM  Never done  . Hepatitis C Screening  Never done  . TETANUS/TDAP  Never done  . COLONOSCOPY (Pts 45-72yrs Insurance coverage will need to be confirmed)  Never done    There are no preventive care reminders to display for this patient.  No results found for: TSH Lab Results  Component Value Date   WBC 12.5 (H) 07/15/2017   HGB 13.8 07/15/2017   HCT 39.1 (L) 07/15/2017   MCV 86.9 07/15/2017   PLT 198 07/15/2017   Lab Results  Component Value Date   NA 137 07/15/2017   K 3.6 07/15/2017   CO2 22 07/15/2017   GLUCOSE 247 (H) 07/15/2017   BUN 13 07/15/2017   CREATININE 0.77 07/15/2017   BILITOT 0.9 07/13/2017   ALKPHOS 79 07/13/2017   AST 74 (H) 07/13/2017   ALT 82 (H) 07/13/2017   PROT 7.8 07/13/2017   ALBUMIN 4.3 07/13/2017   CALCIUM 8.1 (L) 07/15/2017   ANIONGAP 10 07/15/2017   No results found for: CHOL No results found for: HDL No results found for: LDLCALC No results found  for: TRIG No results found for: CHOLHDL No results found for: HGBA1C    Assessment & Plan:   Problem List Items Addressed This Visit      Cardiovascular and Mediastinum   Hypertension    Blood pressure is stable on today's examination        Respiratory   Seasonal allergic rhinitis due to pollen    Patient was advised to take Zyrtec 10 mg p.o. daily      Sleep apnea - Primary    Stable at the present time patient was advised to lose weight.        Endocrine   Diabetes 1.5, managed as type 2 ([place])    - The patient's blood sugar is under control on med. - The patient will continue the current treatment regimen.  - I encouraged the patient to regularly check blood sugar.  - I encouraged the patient to monitor diet. I encouraged the patient to eat low-carb and low-sugar to help prevent blood sugar spikes.  - I encouraged the patient to continue following their prescribed treatment plan for diabetes - I informed the patient to get help if blood sugar drops below 54mg /dL, or if suddenly have trouble thinking clearly or breathing.         Other   Obesity    - I encouraged the patient to lose weight.  - I educated them on making healthy dietary choices including eating more fruits and vegetables  and less fried foods. - I encouraged the patient to exercise more, and educated on the benefits of exercise including weight loss, diabetes prevention, and hypertension prevention.   Dietary counseling with a registered dietician  Referral to a weight management support group (e.g. Weight Watchers, Overeaters Anonymous)  If your BMI is greater than 29 or you have gained more than 15 pounds you should work on weight loss.  Attend a healthy cooking class        Patient complaining of sinus problem.  He was also found to be wheezing.  She was given Medrol Dosepak 4 mg and was advised to take Zyrtec 10 mg p.o. daily.  No orders of the defined types were placed in this  encounter.   Follow-up: No follow-ups on file.    Cletis Athens, MD

## 2020-08-23 NOTE — Assessment & Plan Note (Signed)
Blood pressure is stable on today's examination

## 2020-08-23 NOTE — Assessment & Plan Note (Signed)

## 2020-08-23 NOTE — Assessment & Plan Note (Signed)
Stable at the present time patient was advised to lose weight.

## 2020-12-09 ENCOUNTER — Other Ambulatory Visit: Payer: Self-pay

## 2020-12-09 DIAGNOSIS — J069 Acute upper respiratory infection, unspecified: Secondary | ICD-10-CM

## 2020-12-09 MED ORDER — MONTELUKAST SODIUM 10 MG PO TABS: 10.0000 mg | ORAL_TABLET | Freq: Every day | ORAL | 6 refills | Status: DC

## 2020-12-09 MED ORDER — AMILORIDE-HYDROCHLOROTHIAZIDE 5-50 MG PO TABS: 1.0000 | ORAL_TABLET | Freq: Every day | ORAL | 1 refills | Status: DC

## 2021-02-10 ENCOUNTER — Other Ambulatory Visit: Payer: Self-pay | Admitting: *Deleted

## 2021-02-10 MED ORDER — TRAZODONE HCL 50 MG PO TABS: 50.0000 mg | ORAL_TABLET | Freq: Every day | ORAL | 6 refills | Status: DC

## 2021-03-29 ENCOUNTER — Other Ambulatory Visit: Payer: Self-pay

## 2021-03-29 DIAGNOSIS — J069 Acute upper respiratory infection, unspecified: Secondary | ICD-10-CM

## 2021-03-29 MED ORDER — MONTELUKAST SODIUM 10 MG PO TABS: 10.0000 mg | ORAL_TABLET | Freq: Every day | ORAL | 6 refills | Status: DC

## 2021-03-29 MED ORDER — GLIMEPIRIDE 4 MG PO TABS: 4.0000 mg | ORAL_TABLET | Freq: Every day | ORAL | 3 refills | Status: DC

## 2021-06-19 ENCOUNTER — Other Ambulatory Visit: Payer: Self-pay | Admitting: *Deleted

## 2021-06-19 DIAGNOSIS — I10 Essential (primary) hypertension: Secondary | ICD-10-CM

## 2021-06-19 MED ORDER — GLIMEPIRIDE 4 MG PO TABS: 4.0000 mg | ORAL_TABLET | Freq: Every day | ORAL | 3 refills | Status: DC

## 2021-06-19 MED ORDER — LOSARTAN POTASSIUM 100 MG PO TABS: 100.0000 mg | ORAL_TABLET | Freq: Every day | ORAL | 3 refills | Status: DC

## 2021-06-20 ENCOUNTER — Other Ambulatory Visit: Payer: Self-pay | Admitting: Internal Medicine

## 2021-07-06 DIAGNOSIS — G4733 Obstructive sleep apnea (adult) (pediatric): Secondary | ICD-10-CM | POA: Diagnosis not present

## 2021-07-18 ENCOUNTER — Other Ambulatory Visit: Payer: Self-pay

## 2021-08-29 ENCOUNTER — Other Ambulatory Visit: Payer: Self-pay

## 2021-11-30 ENCOUNTER — Ambulatory Visit: Payer: BC Managed Care – PPO | Admitting: Nurse Practitioner

## 2021-11-30 ENCOUNTER — Encounter: Payer: Self-pay | Admitting: Nurse Practitioner

## 2021-11-30 VITALS — BP 148/93 | HR 74 | Ht 69.0 in | Wt 275.6 lb

## 2021-11-30 DIAGNOSIS — J069 Acute upper respiratory infection, unspecified: Secondary | ICD-10-CM

## 2021-11-30 DIAGNOSIS — G4737 Central sleep apnea in conditions classified elsewhere: Secondary | ICD-10-CM

## 2021-11-30 DIAGNOSIS — J449 Chronic obstructive pulmonary disease, unspecified: Secondary | ICD-10-CM | POA: Diagnosis not present

## 2021-11-30 DIAGNOSIS — I10 Essential (primary) hypertension: Secondary | ICD-10-CM

## 2021-11-30 DIAGNOSIS — E139 Other specified diabetes mellitus without complications: Secondary | ICD-10-CM

## 2021-11-30 LAB — GLUCOSE, POCT (MANUAL RESULT ENTRY): POC Glucose: 182 mg/dl — AB (ref 70–99)

## 2021-11-30 LAB — POCT GLYCOSYLATED HEMOGLOBIN (HGB A1C): HbA1c POC (<> result, manual entry): 7.1 % (ref 4.0–5.6)

## 2021-11-30 MED ORDER — GLIMEPIRIDE 4 MG PO TABS: 4.0000 mg | ORAL_TABLET | Freq: Every day | ORAL | 1 refills | Status: AC

## 2021-11-30 MED ORDER — LOSARTAN POTASSIUM 100 MG PO TABS
100.0000 mg | ORAL_TABLET | Freq: Every day | ORAL | 1 refills | Status: AC
Start: 2021-11-30 — End: ?

## 2021-11-30 MED ORDER — MONTELUKAST SODIUM 10 MG PO TABS: 10.0000 mg | ORAL_TABLET | Freq: Every day | ORAL | 1 refills | Status: AC

## 2021-11-30 MED ORDER — TRAZODONE HCL 50 MG PO TABS: 50.0000 mg | ORAL_TABLET | Freq: Every day | ORAL | 1 refills | Status: AC

## 2021-11-30 MED ORDER — AMILORIDE-HYDROCHLOROTHIAZIDE 5-50 MG PO TABS: 1.0000 | ORAL_TABLET | Freq: Every day | ORAL | 1 refills | Status: AC

## 2021-11-30 MED ORDER — METFORMIN HCL 500 MG PO TABS: 500.0000 mg | ORAL_TABLET | Freq: Two times a day (BID) | ORAL | 1 refills | Status: AC

## 2021-11-30 NOTE — Progress Notes (Signed)
Established Patient Office Visit  Subjective:  Patient ID: Brandon Mani., male    DOB: May 21, 1975  Age: 46 y.o. MRN: [id number]  CC:  Chief Complaint  Patient presents with   Follow-up     HPI  Brandon Kindred. presents for follow up. He works at [employer]. He was out of the blood pressure medication from couple of days. He check his BS at home and ranges between 150 - 200.  Patient states that he is taking metformin 500 mg, glipiride and insulin .  HPI   Past Medical History:  Diagnosis Date   COPD (chronic obstructive pulmonary disease) ([place])    Hypertension    Sleep apnea     Past Surgical History:  Procedure Laterality Date   LESION REMOVAL     back    Family History  Problem Relation Age of Onset   Cancer Mother        ovarian   Cancer Maternal Grandmother        pancreatic, stomach, liver    Social History   Socioeconomic History   Marital status: Single    Spouse name: Not on file   Number of children: Not on file   Years of education: Not on file   Highest education level: Not on file  Occupational History   Not on file  Tobacco Use   Smoking status: Former    Packs/day: 1.00    Years: 5.00    Total pack years: 5.00    Types: Cigarettes   Smokeless tobacco: Current    Types: Chew  Substance and Sexual Activity   Alcohol use: No   Drug use: No   Sexual activity: Not on file  Other Topics Concern   Not on file  Social History Narrative   Not on file   Social Determinants of Health   Financial Resource Strain: Not on file  Food Insecurity: Not on file  Transportation Needs: Not on file  Physical Activity: Not on file  Stress: Not on file  Social Connections: Not on file  Intimate Partner Violence: Not on file     Outpatient Medications Prior to Visit  Medication Sig Dispense Refill   albuterol (PROVENTIL HFA;VENTOLIN HFA) 108 (90 Base) MCG/ACT inhaler Inhale 2 puffs into the lungs every 4  (four) hours as needed for wheezing or shortness of breath. 1 Inhaler 0   atorvastatin (LIPITOR) 10 MG tablet Take 1 tablet (10 mg total) by mouth daily. 90 tablet 3   cyclobenzaprine (FLEXERIL) 10 MG tablet Take 1 tablet (10 mg total) by mouth 3 (three) times daily as needed for muscle spasms. 30 tablet 0   Insulin Glargine (BASAGLAR KWIKPEN) 100 UNIT/ML Inject 0.4 mLs (40 Units total) into the skin daily. 1 pen 6   ipratropium-albuterol (DUONEB) 0.5-2.5 (3) MG/3ML SOLN Take 3 mLs by nebulization every 4 (four) hours as needed. 360 mL 6   Multiple Vitamins-Minerals (MULTIVITAMIN WITH MINERALS) tablet Take 1 tablet by mouth daily.     omega-3 acid ethyl esters (LOVAZA) 1 g capsule Take 1 g by mouth daily.     vitamin C (ASCORBIC ACID) 500 MG tablet Take 1,000 mg by mouth daily.     amiloride-hydrochlorothiazide (MODURETIC) 5-50 MG tablet Take 1 tablet by mouth once daily 90 tablet 0   losartan (COZAAR) 100 MG tablet Take 1 tablet (100 mg total) by mouth daily. 90 tablet 3   metFORMIN (GLUCOPHAGE) 500 MG tablet Take 1 tablet (500 mg  total) by mouth 2 (two) times daily with a meal. 180 tablet 3   montelukast (SINGULAIR) 10 MG tablet Take 1 tablet (10 mg total) by mouth daily. 30 tablet 6   traZODone (DESYREL) 50 MG tablet Take 1 tablet (50 mg total) by mouth at bedtime. 30 tablet 6   acetaminophen (TYLENOL) 325 MG tablet Take 2 tablets (650 mg total) by mouth every 6 (six) hours as needed for mild pain (or Fever >/= 101). 20 tablet 0   aspirin 81 MG EC tablet Take 81 mg by mouth daily as needed.      Biotin 5000 MCG CAPS Take 1 capsule by mouth daily.     glucose blood (CONTOUR NEXT TEST) test strip Check blood sugary BID 100 each 12   azithromycin (ZITHROMAX) 250 MG tablet 2 tab po daily fo3 days 6 tablet 0   benzonatate (TESSALON) 200 MG capsule Take 1 capsule (200 mg total) by mouth 3 (three) times daily as needed for cough. 20 capsule 0   doxycycline (VIBRA-TABS) 100 MG tablet Take 1 tablet (100  mg total) by mouth every 12 (twelve) hours. 6 tablet 0   glimepiride (AMARYL) 4 MG tablet Take 1 tablet (4 mg total) by mouth daily. 90 tablet 3.   glimepiride (AMARYL) 4 MG tablet Take 1 tablet (4 mg total) by mouth daily before breakfast. 90 tablet 3   naproxen (NAPROSYN) 500 MG tablet Take 1 tablet (500 mg total) by mouth 2 (two) times daily with a meal. (Patient taking differently: Take 500 mg by mouth 2 (two) times daily as needed.) 30 tablet 0   oseltamivir (TAMIFLU) 75 MG capsule Take 1 capsule (75 mg total) by mouth 2 (two) times daily. 6 capsule 0   predniSONE (DELTASONE) 50 MG tablet 1 po daily 5 tablet 0   No facility-administered medications prior to visit.    No Known Allergies  ROS Review of Systems  Constitutional: Negative.   HENT: Negative.    Eyes: Negative.   Respiratory:  Negative for apnea and shortness of breath.   Cardiovascular:  Negative for chest pain and palpitations.  Gastrointestinal: Negative.   Genitourinary: Negative.   Musculoskeletal: Negative.   Neurological:  Negative for dizziness, facial asymmetry and headaches.  Psychiatric/Behavioral:  Negative for agitation, behavioral problems and confusion.       Objective:    Physical Exam Constitutional:      Appearance: Normal appearance. He is obese.  HENT:     Head: Normocephalic and atraumatic.     Right Ear: Tympanic membrane normal.     Left Ear: Tympanic membrane normal.     Nose: Nose normal.     Mouth/Throat:     Mouth: Mucous membranes are moist.     Pharynx: Oropharynx is clear.  Eyes:     Conjunctiva/sclera: Conjunctivae normal.     Pupils: Pupils are equal, round, and reactive to light.  Cardiovascular:     Rate and Rhythm: Normal rate and regular rhythm.     Pulses: Normal pulses.     Heart sounds: Normal heart sounds. No murmur heard. Pulmonary:     Effort: Pulmonary effort is normal.     Breath sounds: Normal breath sounds. No stridor. No wheezing.  Abdominal:     General:  Bowel sounds are normal.     Palpations: Abdomen is soft. There is no mass.     Tenderness: There is no abdominal tenderness.  Musculoskeletal:        General: Normal range of  motion.     Cervical back: Neck supple. No tenderness.  Skin:    General: Skin is warm.     Capillary Refill: Capillary refill takes less than 2 seconds.  Neurological:     General: No focal deficit present.     Mental Status: He is alert. Mental status is at baseline.  Psychiatric:        Mood and Affect: Mood normal.        Behavior: Behavior normal.        Thought Content: Thought content normal.        Judgment: Judgment normal.     BP (!) 148/93   Pulse 74   Ht '5\' 9"'$  (1.753 m)   Wt 275 lb 9.6 oz (125 kg)   BMI 40.70 kg/m  Wt Readings from Last 3 Encounters:  11/30/21 275 lb 9.6 oz (125 kg)  08/23/20 278 lb 1.6 oz (126.1 kg)  08/11/20 290 lb 9.6 oz (131.8 kg)     Health Maintenance  Topic Date Due   COVID-19 Vaccine (1) Never done   FOOT EXAM  Never done   OPHTHALMOLOGY EXAM  Never done   Hepatitis C Screening  Never done   TETANUS/TDAP  Never done   COLONOSCOPY (Pts 45-1yr Insurance coverage will need to be confirmed)  Never done   INFLUENZA VACCINE  11/14/2021   HEMOGLOBIN A1C  06/02/2022   HIV Screening  Completed   HPV VACCINES  Aged Out    There are no preventive care reminders to display for this patient.  No results found for: "TSH" Lab Results  Component Value Date   WBC 12.5 (H) 07/15/2017   HGB 13.8 07/15/2017   HCT 39.1 (L) 07/15/2017   MCV 86.9 07/15/2017   PLT 198 07/15/2017   Lab Results  Component Value Date   NA 137 07/15/2017   K 3.6 07/15/2017   CO2 22 07/15/2017   GLUCOSE 247 (H) 07/15/2017   BUN 13 07/15/2017   CREATININE 0.77 07/15/2017   BILITOT 0.9 07/13/2017   ALKPHOS 79 07/13/2017   AST 74 (H) 07/13/2017   ALT 82 (H) 07/13/2017   PROT 7.8 07/13/2017   ALBUMIN 4.3 07/13/2017   CALCIUM 8.1 (L) 07/15/2017   ANIONGAP 10 07/15/2017   No results  found for: "CHOL" No results found for: "HDL" No results found for: "LDLCALC" No results found for: "TRIG" No results found for: "CHOLHDL" Lab Results  Component Value Date   HGBA1C 7.1 11/30/2021      Assessment & Plan:   Problem List Items Addressed This Visit       Cardiovascular and Mediastinum   Hypertension    Blood pressure not stable 157/87 and 148/93 in the office today. Continue the current medication. Encourage patient to consume low-salt heart healthy diet. Advised patient to      Relevant Medications   amiloride-hydrochlorothiazide (MODURETIC) 5-50 MG tablet   losartan (COZAAR) 100 MG tablet     Respiratory   Sleep apnea    Stable on CPAP. Refill trazodone 50 mg for sleep as needed.      Chronic obstructive pulmonary disease ([facility]) - Primary    Stable at present. Continue albuterol inhaler as needed. Refilled montelukast 10 mg.      Relevant Medications   montelukast (SINGULAIR) 10 MG tablet     Endocrine   Diabetes 1.5, managed as type 2 ([facility])    Hemoglobin A1c 7.1 and blood sugar 182 in the office today. Advised pt to check  the BS regularly, make a log and bring to next appointment.  Advised pt to monitor diet. Advised pt to eat variety of food including fruits, vegetables, whole grains, complex carbohydrates and proteins.  Refilled metformin and glimepiride.  Patient states that he does not want refill for insulin at present.            Relevant Medications   glimepiride (AMARYL) 4 MG tablet   losartan (COZAAR) 100 MG tablet   metFORMIN (GLUCOPHAGE) 500 MG tablet   Other Relevant Orders   POCT glucose (manual entry) (Completed)   POCT HgB A1C (Completed)     Other   Morbid obesity ([facility])    Body mass index is 40.7 kg/m. Advised pt to lose weight. Advised patient to avoid trans fat, fatty and fried food. Follow a regular physical activity schedule. Went over the risk of chronic diseases with increased weight.           Relevant  Medications   glimepiride (AMARYL) 4 MG tablet   metFORMIN (GLUCOPHAGE) 500 MG tablet     Meds ordered this encounter  Medications   amiloride-hydrochlorothiazide (MODURETIC) 5-50 MG tablet    Sig: Take 1 tablet by mouth daily.    Dispense:  90 tablet    Refill:  1   glimepiride (AMARYL) 4 MG tablet    Sig: Take 1 tablet (4 mg total) by mouth daily.    Dispense:  90 tablet    Refill:  1   losartan (COZAAR) 100 MG tablet    Sig: Take 1 tablet (100 mg total) by mouth daily.    Dispense:  90 tablet    Refill:  1   metFORMIN (GLUCOPHAGE) 500 MG tablet    Sig: Take 1 tablet (500 mg total) by mouth 2 (two) times daily with a meal.    Dispense:  180 tablet    Refill:  1   montelukast (SINGULAIR) 10 MG tablet    Sig: Take 1 tablet (10 mg total) by mouth daily.    Dispense:  90 tablet    Refill:  1   traZODone (DESYREL) 50 MG tablet    Sig: Take 1 tablet (50 mg total) by mouth at bedtime.    Dispense:  90 tablet    Refill:  1     Follow-up: Return in about 1 week (around 12/07/2021) for BP check with nurse and labs next week.    Theresia Lo, NP

## 2021-12-06 DIAGNOSIS — J449 Chronic obstructive pulmonary disease, unspecified: Secondary | ICD-10-CM | POA: Insufficient documentation

## 2021-12-06 NOTE — Assessment & Plan Note (Addendum)
Hemoglobin A1c 7.1 and blood sugar 182 in the office today. Advised pt to check the BS regularly, make a log and bring to next appointment.  Advised pt to monitor diet. Advised pt to eat variety of food including fruits, vegetables, whole grains, complex carbohydrates and proteins.  Refilled metformin and glimepiride.  Patient states that he does not want refill for insulin at present.

## 2021-12-06 NOTE — Assessment & Plan Note (Signed)
Blood pressure not stable 157/87 and 148/93 in the office today. Continue the current medication. Encourage patient to consume low-salt heart healthy diet. Advised patient to

## 2021-12-06 NOTE — Assessment & Plan Note (Signed)
Stable at present. Continue albuterol inhaler as needed. Refilled montelukast 10 mg.

## 2021-12-06 NOTE — Assessment & Plan Note (Signed)
Stable on CPAP. Refill trazodone 50 mg for sleep as needed.

## 2021-12-06 NOTE — Assessment & Plan Note (Signed)
Body mass index is 40.7 kg/m. Advised pt to lose weight. Advised patient to avoid trans fat, fatty and fried food. Follow a regular physical activity schedule. Went over the risk of chronic diseases with increased weight.

## 2022-02-01 ENCOUNTER — Ambulatory Visit: Payer: BC Managed Care – PPO | Admitting: Nurse Practitioner
